# Patient Record
Sex: Female | Born: 1990 | Race: Black or African American | Hispanic: No | Marital: Single | Smoking: Former smoker
Health system: Southern US, Community
[De-identification: ages and names within clinical notes are randomized; demographics above are authoritative.]

## PROBLEM LIST (undated history)

## (undated) DIAGNOSIS — R000 Tachycardia, unspecified: Secondary | ICD-10-CM

## (undated) DIAGNOSIS — I10 Essential (primary) hypertension: Secondary | ICD-10-CM

## (undated) DIAGNOSIS — E119 Type 2 diabetes mellitus without complications: Secondary | ICD-10-CM

## (undated) DIAGNOSIS — N939 Abnormal uterine and vaginal bleeding, unspecified: Secondary | ICD-10-CM

## (undated) HISTORY — DX: Type 2 diabetes mellitus without complications: E11.9

## (undated) HISTORY — DX: Abnormal uterine and vaginal bleeding, unspecified: N93.9

## (undated) HISTORY — DX: Essential (primary) hypertension: I10

## (undated) HISTORY — DX: Tachycardia, unspecified: R00.0

---

## 2019-06-01 ENCOUNTER — Emergency Department (HOSPITAL_COMMUNITY): Payer: 59

## 2019-06-01 ENCOUNTER — Encounter (HOSPITAL_COMMUNITY): Payer: Self-pay

## 2019-06-01 ENCOUNTER — Emergency Department (HOSPITAL_COMMUNITY)
Admission: EM | Admit: 2019-06-01 | Discharge: 2019-06-01 | Disposition: A | Discharge status: Home / Self Care | Payer: 59 | Attending: Emergency Medicine | Admitting: Emergency Medicine

## 2019-06-01 ENCOUNTER — Other Ambulatory Visit: Payer: Self-pay

## 2019-06-01 DIAGNOSIS — Z79899 Other long term (current) drug therapy: Secondary | ICD-10-CM | POA: Diagnosis not present

## 2019-06-01 DIAGNOSIS — R000 Tachycardia, unspecified: Secondary | ICD-10-CM | POA: Insufficient documentation

## 2019-06-01 DIAGNOSIS — I10 Essential (primary) hypertension: Secondary | ICD-10-CM | POA: Diagnosis not present

## 2019-06-01 DIAGNOSIS — N939 Abnormal uterine and vaginal bleeding, unspecified: Secondary | ICD-10-CM | POA: Diagnosis present

## 2019-06-01 DIAGNOSIS — R1030 Lower abdominal pain, unspecified: Secondary | ICD-10-CM | POA: Diagnosis not present

## 2019-06-01 DIAGNOSIS — F17290 Nicotine dependence, other tobacco product, uncomplicated: Secondary | ICD-10-CM | POA: Diagnosis not present

## 2019-06-01 DIAGNOSIS — R531 Weakness: Secondary | ICD-10-CM | POA: Diagnosis not present

## 2019-06-01 DIAGNOSIS — R0600 Dyspnea, unspecified: Secondary | ICD-10-CM | POA: Diagnosis not present

## 2019-06-01 DIAGNOSIS — R42 Dizziness and giddiness: Secondary | ICD-10-CM | POA: Insufficient documentation

## 2019-06-01 LAB — COMPREHENSIVE METABOLIC PANEL
ALT: 32 U/L (ref 0–44)
AST: 32 U/L (ref 15–41)
Albumin: 3.8 g/dL (ref 3.5–5.0)
Alkaline Phosphatase: 66 U/L (ref 38–126)
Anion gap: 14 (ref 5–15)
BUN: 9 mg/dL (ref 6–20)
CO2: 22 mmol/L (ref 22–32)
Calcium: 8.9 mg/dL (ref 8.9–10.3)
Chloride: 100 mmol/L (ref 98–111)
Creatinine, Ser: 0.75 mg/dL (ref 0.44–1.00)
GFR calc Af Amer: >60 mL/min (ref >60)
GFR calc non Af Amer: >60 mL/min (ref >60)
Glucose, Bld: 263 mg/dL — ABNORMAL HIGH (ref 70–99)
Potassium: 4.2 mmol/L (ref 3.5–5.1)
Sodium: 136 mmol/L (ref 135–145)
Total Bilirubin: 0.9 mg/dL (ref 0.3–1.2)
Total Protein: 7.6 g/dL (ref 6.5–8.1)

## 2019-06-01 LAB — URINALYSIS, ROUTINE W REFLEX MICROSCOPIC
Bacteria, UA: NONE SEEN
Bilirubin Urine: NEGATIVE
Glucose, UA: >=500 mg/dL — AB
Ketones, ur: 80 mg/dL — AB
Leukocytes,Ua: NEGATIVE
Nitrite: NEGATIVE
Protein, ur: 100 mg/dL — AB
RBC / HPF: >50 RBC/hpf — ABNORMAL HIGH (ref 0–5)
Specific Gravity, Urine: 1.035 — ABNORMAL HIGH (ref 1.005–1.030)
pH: 5 (ref 5.0–8.0)

## 2019-06-01 LAB — CBC WITH DIFFERENTIAL/PLATELET
Abs Immature Granulocytes: 0.02 10*3/uL (ref 0.00–0.07)
Basophils Absolute: 0 10*3/uL (ref 0.0–0.1)
Basophils Relative: 1 %
Eosinophils Absolute: 0.1 10*3/uL (ref 0.0–0.5)
Eosinophils Relative: 1 %
HCT: 43.3 % (ref 36.0–46.0)
Hemoglobin: 14.4 g/dL (ref 12.0–15.0)
Immature Granulocytes: 0 %
Lymphocytes Relative: 34 %
Lymphs Abs: 2.8 10*3/uL (ref 0.7–4.0)
MCH: 31 pg (ref 26.0–34.0)
MCHC: 33.3 g/dL (ref 30.0–36.0)
MCV: 93.1 fL (ref 80.0–100.0)
Monocytes Absolute: 0.5 10*3/uL (ref 0.1–1.0)
Monocytes Relative: 6 %
Neutro Abs: 4.7 10*3/uL (ref 1.7–7.7)
Neutrophils Relative %: 58 %
Platelets: 347 10*3/uL (ref 150–400)
RBC: 4.65 MIL/uL (ref 3.87–5.11)
RDW: 12.4 % (ref 11.5–15.5)
WBC: 8.2 10*3/uL (ref 4.0–10.5)
nRBC: 0 % (ref 0.0–0.2)

## 2019-06-01 LAB — WET PREP, GENITAL
Clue Cells Wet Prep HPF POC: NONE SEEN
Sperm: NONE SEEN
Trich, Wet Prep: NONE SEEN
Yeast Wet Prep HPF POC: NONE SEEN

## 2019-06-01 LAB — TSH: TSH: 1.636 u[IU]/mL (ref 0.350–4.500)

## 2019-06-01 LAB — TYPE AND SCREEN
ABO/RH(D): O POS
Antibody Screen: NEGATIVE

## 2019-06-01 LAB — I-STAT BETA HCG BLOOD, ED (MC, WL, AP ONLY): I-stat hCG, quantitative: <5 m[IU]/mL (ref <5)

## 2019-06-01 MED ORDER — IOHEXOL 350 MG/ML SOLN
100.0000 mL | Freq: Once | INTRAVENOUS | Status: AC | PRN
  Administered 2019-06-01: 100 mL via INTRAVENOUS

## 2019-06-01 MED ORDER — SODIUM CHLORIDE 0.9 % IV BOLUS: 1000.0000 mL | Freq: Once | INTRAVENOUS | Status: DC

## 2019-06-01 MED ORDER — SODIUM CHLORIDE 0.9 % IV BOLUS
1000.0000 mL | Freq: Once | INTRAVENOUS | Status: AC
  Administered 2019-06-01: 1000 mL via INTRAVENOUS

## 2019-06-01 MED ORDER — MEGESTROL ACETATE 40 MG PO TABS: ORAL_TABLET | ORAL | 0 refills | Status: AC

## 2019-06-01 MED ORDER — SODIUM CHLORIDE (PF) 0.9 % IJ SOLN
INTRAMUSCULAR | Status: AC
  Filled 2019-06-01: qty 50

## 2019-06-01 NOTE — ED Provider Notes (Signed)
[facility] DEPT Provider Note   CSN: [id number] Arrival date & time: 06/01/19  1157     History Chief Complaint  Patient presents with  . Vaginal Bleeding    Lindsay Welch is a 29 y.o. female presents today for evaluation of acute onset, progressively worsening vaginal bleeding.  She reports that she had a regular menstrual bleeding since August after losing her job.  Reports that she will have constant bleeding daily for over a month and then it will stop for a couple of weeks and then return again.  Reports that most recently her bleeding started up again 3 days ago and notes that it is quite heavy, worse than usual.  Reports she is saturating a pad every 2 hours and has gone through 7 pads since yesterday.  Notes some mild lower abdominal cramping consistent with her usual menstrual cramps.  Denies nausea or vomiting.  Reports she has felt generally weak with dyspnea on exertion and lightheadedness with position changes.  Denies syncope, fevers, chills, chest pain.  She has noted some urinary urgency and frequency but denies dysuria, no abnormal vaginal discharge.  Has not tried anything for her symptoms.  She is a non-smoker.  The history is provided by the patient.       History reviewed. No pertinent past medical history.  There are no problems to display for this patient.   History reviewed. No pertinent surgical history.   OB History   No obstetric history on file.     No family history on file.  Social History   Tobacco Use  . Smoking status: Current Some Day Smoker    Types: Cigars  . Smokeless tobacco: Never Used  Substance Use Topics  . Alcohol use: Never  . Drug use: Never    Home Medications Prior to Admission medications   Medication Sig Start Date End Date Taking? Authorizing Provider  Ascorbic Acid (VITAMIN C) 100 MG tablet Take 100 mg by mouth daily.   Yes [provider]  Biotin 1 MG CAPS Take 1 capsule by  mouth daily.   Yes [provider]  cholecalciferol (VITAMIN D3) 25 MCG (1000 UT) tablet Take 1,000 Units by mouth daily.   Yes [provider]  co-enzyme Q-10 30 MG capsule Take 30 mg by mouth daily.   Yes [provider]  Misc Natural Products (TART CHERRY ADVANCED) CAPS Take 1 capsule by mouth daily.   Yes [provider]  Multiple Vitamins-Minerals (ZINC PO) Take 1 tablet by mouth daily.   Yes [provider]  Omega-3 Fatty Acids (FISH OIL) 1000 MG CAPS Take 1 capsule by mouth daily.   Yes [provider]  vitamin E 100 UNIT capsule Take 100 Units by mouth daily.   Yes [provider]  megestrol (MEGACE) 40 MG tablet Take 3 tablets (120 mg total) by mouth daily for 5 days, THEN 2 tablets (80 mg total) daily for 5 days, THEN 1 tablet (40 mg total) daily. 06/01/19 07/11/19  Rodell Perna A, PA-C    Allergies    Patient has no known allergies.  Review of Systems   Review of Systems  Constitutional: Positive for fatigue. Negative for chills and fever.  Respiratory: Positive for shortness of breath.   Cardiovascular: Positive for palpitations.  Gastrointestinal: Positive for abdominal pain. Negative for constipation, diarrhea, nausea and vomiting.  Genitourinary: Positive for frequency, urgency and vaginal bleeding. Negative for dysuria and hematuria.  Neurological: Positive for light-headedness. Negative for  syncope.  All other systems reviewed and are negative.   Physical Exam Updated Vital Signs BP (!) 141/95 (BP Location: Left Arm)   Pulse 94   Temp 98.6 F (37 C) (Oral)   Resp (!) 22   LMP 06/01/2019 (Approximate)   SpO2 97%   Physical Exam Vitals and nursing note reviewed.  Constitutional:      General: She is not in acute distress.    Appearance: She is well-developed. She is obese.     Comments: Appears anxious  HENT:     Head: Normocephalic and atraumatic.  Eyes:     General:        Right eye: No discharge.         Left eye: No discharge.     Conjunctiva/sclera: Conjunctivae normal.  Neck:     Vascular: No JVD.     Trachea: No tracheal deviation.  Cardiovascular:     Rate and Rhythm: Regular rhythm. Tachycardia present.     Pulses: Normal pulses.     Heart sounds: Normal heart sounds.  Pulmonary:     Effort: Pulmonary effort is normal.     Breath sounds: Normal breath sounds.  Abdominal:     General: Bowel sounds are normal. There is no distension.     Palpations: Abdomen is soft.     Tenderness: There is abdominal tenderness in the right lower quadrant, suprapubic area and left lower quadrant. There is no guarding or rebound.     Comments: Mild discomfort on palpation of the lower abdomen  Genitourinary:    Comments: Examination performed in the presence of a chaperone.  Moderate amount of blood in the vaginal vault.  No cervical motion tenderness or adnexal tenderness.  Examination somewhat limited due to body habitus. Skin:    General: Skin is warm and dry.     Findings: No erythema.  Neurological:     Mental Status: She is alert.  Psychiatric:        Behavior: Behavior normal.     ED Results / Procedures / Treatments   Labs (all labs ordered are listed, but only abnormal results are displayed) Labs Reviewed  WET PREP, GENITAL - Abnormal; Notable for the following components:      Result Value   WBC, Wet Prep HPF POC FEW (*)    All other components within normal limits  COMPREHENSIVE METABOLIC PANEL - Abnormal; Notable for the following components:   Glucose, Bld 263 (*)    All other components within normal limits  URINALYSIS, ROUTINE W REFLEX MICROSCOPIC - Abnormal; Notable for the following components:   APPearance HAZY (*)    Specific Gravity, Urine 1.035 (*)    Glucose, UA >=500 (*)    Hgb urine dipstick LARGE (*)    Ketones, ur 80 (*)    Protein, ur 100 (*)    RBC / HPF >50 (*)    All other components within normal limits  CBC WITH DIFFERENTIAL/PLATELET  TSH    I-STAT BETA HCG BLOOD, ED ([facility], [facility], [facility] ONLY)  TYPE AND SCREEN  ABO/RH  GC/CHLAMYDIA PROBE AMP ([place]) NOT AT [facility]    EKG EKG Interpretation  Date/Time:  Saturday June 01 2019 15:50:32 EST Ventricular Rate:  105 PR Interval:    QRS Duration: 79 QT Interval:  331 QTC Calculation: 438 R Axis:   50 Text Interpretation: Sinus tachycardia No old tracing to compare Confirmed by Mancel Bale [phone]) on 06/01/2019 4:37:25 PM   Radiology CT Angio Chest PE  W and/or Wo Contrast  Result Date: 06/01/2019 CLINICAL DATA:  Chest pain and shortness of breath. Concern for pulmonary embolism. EXAM: CT ANGIOGRAPHY CHEST WITH CONTRAST TECHNIQUE: Multidetector CT imaging of the chest was performed using the standard protocol during bolus administration of intravenous contrast. Multiplanar CT image reconstructions and MIPs were obtained to evaluate the vascular anatomy. CONTRAST:  OMNIPAQUE IOHEXOL 350 MG/ML SOLN COMPARISON:  None. FINDINGS: Cardiovascular: Multifactorial degradation. Primary limitations include motion and patient body habitus. The bolus is poorly timed, further limiting evaluation for pulmonary embolism. No central or large lobar pulmonary embolism. Normal aortic caliber. No dissection. Normal heart size, without pericardial effusion. Mediastinum/Nodes: No mediastinal or hilar adenopathy. Lungs/Pleura: No pleural fluid. Clear lungs. Upper Abdomen: Probable mild hepatic steatosis. Normal imaged portions of the spleen, stomach, pancreas, adrenal glands. Musculoskeletal: No acute osseous abnormality. Mild convex right thoracic spine curvature. Review of the MIP images confirms the above findings. IMPRESSION: 1. Multifactorial degradation, including patient body habitus, motion, and suboptimal bolus timing. No large or central pulmonary embolism. 2. No other explanation for chest pain or shortness of breath. 3. Probable mild hepatic steatosis. Electronically Signed   By: Jeronimo Greaves M.D.    On: 06/01/2019 19:13    Procedures Procedures (including critical care time)  Medications Ordered in ED Medications  sodium chloride (PF) 0.9 % injection (has no administration in time range)  sodium chloride 0.9 % bolus 1,000 mL (0 mLs Intravenous Stopped 06/01/19 1754)  iohexol (OMNIPAQUE) 350 MG/ML injection 100 mL (100 mLs Intravenous Contrast Given 06/01/19 1841)    ED Course  I have reviewed the triage vital signs and the nursing notes.  Pertinent labs & imaging results that were available during my care of the patient were reviewed by me and considered in my medical decision making (see chart for details).    MDM Rules/Calculators/A&P                      Patient presenting for evaluation of abnormal vaginal bleeding that has been ongoing since August (5-6 months).  She is afebrile, initially tachycardic and hypertensive with improvement in heart rate after administration of fluids.  She has had a little bit of generalized weakness and lightheadedness with ambulation, mild dyspnea on exertion however with ambulation her O2 saturations are stable.  She has no real risk factors for PE but cannot PERC her out so a CTA was obtained which showed no evidence of PE.  Doubt ACS/MI, cardiac tamponade, 70 or short, pneumonia or pneumothorax causing her symptoms.  Abdomen is soft with no peritoneal signs.  She has some mild lower abdominal discomfort that has been ongoing with bleeding.  Lab work reviewed by me shows no leukocytosis, no anemia, no metabolic derangements, no renal insufficiency.  Her UA has large amount of blood but this is likely contaminant from vaginal bleeding.  Specific gravity is elevated and she has some ketonuria and proteinuria so likely dehydrated.  A TSH was also obtained as a potential cause of her tachycardia and vaginal bleeding but this was within normal limits.  Her pregnancy test is negative.  Doubt ectopic pregnancy in this case.  Doubt TOA in the absence of fever,  leukocytosis and given the duration of her symptoms.  Doubt ovarian torsion given her pain is quite mild and again has been ongoing for several months.  Doubt acute surgical abdominal pathology.  Emergent imaging is not indicated at this time.  On re-evaluation patient resting comfortably, albeit still  appears anxious. Her anxiety may also be contributing to her abnormal vital signs.  Both heart rate and blood pressure have improved while in the ED.  I recommend follow-up with cardiology on an outpatient basis for re-evaluation of tachycardia and hypertension. Also recommend follow-up with OB/GYN for reevaluation of her dysfunctional uterine bleeding.  We will start her on Megace.  Advised to avoid smoking entirely while on this medication.  Discussed strict ED return precautions. Patient verbalized understanding of and agreement with plan and is safe for discharge home at this time. Discussed workup and plan of care with Dr. Effie Shy who agrees with assessment and plan at this time.     Final Clinical Impression(s) / ED Diagnoses Final diagnoses:  Abnormal vaginal bleeding  Tachycardia  Hypertension, unspecified type    Rx / DC Orders ED Discharge Orders         Ordered    megestrol (MEGACE) 40 MG tablet     06/01/19 2008    Ambulatory referral to Cardiology    Comments: Tachycardia and hypertension   06/01/19 2008           Bennye Alm 06/01/19 2027    Mancel Bale, MD 06/02/19 1219

## 2019-06-01 NOTE — ED Notes (Signed)
Pelvic exam at bedside

## 2019-06-01 NOTE — ED Notes (Signed)
Pt ambulated to restroom w/o assistance. 

## 2019-06-01 NOTE — ED Notes (Signed)
Pt provided cup of water & sandwich.

## 2019-06-01 NOTE — ED Notes (Signed)
Clean catch obtained for urine.

## 2019-06-01 NOTE — ED Triage Notes (Signed)
Pt presents with c/o vaginal bleeding. Pt reports that she was passing some clots yesterday, none today. Pt reports she was bleeding for approx one month, the bleeding stopped around Thanksgiving for a couple of weeks and it has now started back up.

## 2019-06-01 NOTE — ED Notes (Signed)
o2 sat remained 99%-100% while ambulating.

## 2019-06-01 NOTE — Discharge Instructions (Signed)
Start taking Megace as prescribed.  You will take 3 tablets daily for 5 days, 2 tablets daily for 5 days, then 1 tablet daily. Do not smoke while on this medication.   You can take 1 to 2 tablets of Tylenol (350mg -1000mg  depending on the dose) every 6 hours as needed for pain.  Do not exceed 4000 mg of Tylenol daily.  If your pain persists you can take a dose of ibuprofen in between doses of Tylenol.  I usually recommend 400 to 600 mg of ibuprofen every 6 hours.  Take this with food to avoid upset stomach issues.  Drink plenty of fluids and get some rest.  Call the OB/GYN on Monday to schedule follow-up appointment for reevaluation of your bleeding.  Call cardiology to schedule follow-up for reevaluation of your mildly fast heart rate and blood pressure which was a little high today.

## 2019-06-02 LAB — ABO/RH: ABO/RH(D): O POS

## 2019-06-04 ENCOUNTER — Other Ambulatory Visit: Payer: Self-pay

## 2019-06-04 ENCOUNTER — Telehealth: Payer: Self-pay

## 2019-06-04 ENCOUNTER — Encounter: Payer: Self-pay | Admitting: Cardiology

## 2019-06-04 ENCOUNTER — Telehealth (INDEPENDENT_AMBULATORY_CARE_PROVIDER_SITE_OTHER): Payer: Self-pay | Admitting: Cardiology

## 2019-06-04 VITALS — Ht 64.0 in | Wt 300.0 lb

## 2019-06-04 DIAGNOSIS — R000 Tachycardia, unspecified: Secondary | ICD-10-CM

## 2019-06-04 DIAGNOSIS — I10 Essential (primary) hypertension: Secondary | ICD-10-CM

## 2019-06-04 DIAGNOSIS — R0683 Snoring: Secondary | ICD-10-CM

## 2019-06-04 LAB — GC/CHLAMYDIA PROBE AMP (~~LOC~~) NOT AT ARMC
Chlamydia: NEGATIVE
Neisseria Gonorrhea: UNDETERMINED

## 2019-06-04 NOTE — Patient Instructions (Addendum)
Medication Instructions:  Your physician recommends that you continue on your current medications as directed. Please refer to the Current Medication list given to you today.  *If you need a refill on your cardiac medications before your next appointment, please call your pharmacy*  Testing/Procedures: Your physician has requested that you have an echocardiogram. Echocardiography is a painless test that uses sound waves to create images of your heart. It provides your doctor with information about the size and shape of your heart and how well your heart's chambers and valves are working. This procedure takes approximately one hour. There are no restrictions for this procedure.  Your physician has requested that you wear a 24-hour blood pressure monitor.   Your physician has recommended that you have a sleep study. This test records several body functions during sleep, including: brain activity, eye movement, oxygen and carbon dioxide blood levels, heart rate and rhythm, breathing rate and rhythm, the flow of air through your mouth and nose, snoring, body muscle movements, and chest and belly movement.   Follow-Up: At [facility], you and your health needs are our priority.  As part of our continuing mission to provide you with exceptional heart care, we have created designated Provider Care Teams.  These Care Teams include your primary Cardiologist (physician) and Advanced Practice Providers (APPs -  Physician Assistants and Nurse Practitioners) who all work together to provide you with the care you need, when you need it.  Follow-up with Dr. Mayford Knife as needed.   Other Instructions  Low-Sodium Eating Plan Sodium, which is an element that makes up salt, helps you maintain a healthy balance of fluids in your body. Too much sodium can increase your blood pressure and cause fluid and waste to be held in your body. Your health care provider or dietitian may recommend following this plan if you  have high blood pressure (hypertension), kidney disease, liver disease, or heart failure. Eating less sodium can help lower your blood pressure, reduce swelling, and protect your heart, liver, and kidneys. What are tips for following this plan? General guidelines  Most people on this plan should limit their sodium intake to 1,500-2,000 mg (milligrams) of sodium each day. Reading food labels   The Nutrition Facts label lists the amount of sodium in one serving of the food. If you eat more than one serving, you must multiply the listed amount of sodium by the number of servings.  Choose foods with less than 140 mg of sodium per serving.  Avoid foods with 300 mg of sodium or more per serving. Shopping  Look for lower-sodium products, often labeled as "low-sodium" or "no salt added."  Always check the sodium content even if foods are labeled as "unsalted" or "no salt added".  Buy fresh foods. ? Avoid canned foods and premade or frozen meals. ? Avoid canned, cured, or processed meats  Buy breads that have less than 80 mg of sodium per slice. Cooking  Eat more home-cooked food and less restaurant, buffet, and fast food.  Avoid adding salt when cooking. Use salt-free seasonings or herbs instead of table salt or sea salt. Check with your health care provider or pharmacist before using salt substitutes.  Cook with plant-based oils, such as canola, sunflower, or olive oil. Meal planning  When eating at a restaurant, ask that your food be prepared with less salt or no salt, if possible.  Avoid foods that contain MSG (monosodium glutamate). MSG is sometimes added to [place] food, bouillon, and some canned foods.  What foods are recommended? The items listed may not be a complete list. Talk with your dietitian about what dietary choices are best for you. Grains Low-sodium cereals, including oats, puffed wheat and rice, and shredded wheat. Low-sodium crackers. Unsalted rice. Unsalted pasta.  Low-sodium bread. Whole-grain breads and whole-grain pasta. Vegetables Fresh or frozen vegetables. "No salt added" canned vegetables. "No salt added" tomato sauce and paste. Low-sodium or reduced-sodium tomato and vegetable juice. Fruits Fresh, frozen, or canned fruit. Fruit juice. Meats and other protein foods Fresh or frozen (no salt added) meat, poultry, seafood, and fish. Low-sodium canned tuna and salmon. Unsalted nuts. Dried peas, beans, and lentils without added salt. Unsalted canned beans. Eggs. Unsalted nut butters. Dairy Milk. Soy milk. Cheese that is naturally low in sodium, such as ricotta cheese, fresh mozzarella, or Swiss cheese Low-sodium or reduced-sodium cheese. Cream cheese. Yogurt. Fats and oils Unsalted butter. Unsalted margarine with no trans fat. Vegetable oils such as canola or olive oils. Seasonings and other foods Fresh and dried herbs and spices. Salt-free seasonings. Low-sodium mustard and ketchup. Sodium-free salad dressing. Sodium-free light mayonnaise. Fresh or refrigerated horseradish. Lemon juice. Vinegar. Homemade, reduced-sodium, or low-sodium soups. Unsalted popcorn and pretzels. Low-salt or salt-free chips. What foods are not recommended? The items listed may not be a complete list. Talk with your dietitian about what dietary choices are best for you. Grains Instant hot cereals. Bread stuffing, pancake, and biscuit mixes. Croutons. Seasoned rice or pasta mixes. Noodle soup cups. Boxed or frozen macaroni and cheese. Regular salted crackers. Self-rising flour. Vegetables Sauerkraut, pickled vegetables, and relishes. Olives. [place] fries. Onion rings. Regular canned vegetables (not low-sodium or reduced-sodium). Regular canned tomato sauce and paste (not low-sodium or reduced-sodium). Regular tomato and vegetable juice (not low-sodium or reduced-sodium). Frozen vegetables in sauces. Meats and other protein foods Meat or fish that is salted, canned, smoked, spiced,  or pickled. Bacon, ham, sausage, hotdogs, corned beef, chipped beef, packaged lunch meats, salt pork, jerky, pickled herring, anchovies, regular canned tuna, sardines, salted nuts. Dairy Processed cheese and cheese spreads. Cheese curds. Blue cheese. Feta cheese. String cheese. Regular cottage cheese. Buttermilk. Canned milk. Fats and oils Salted butter. Regular margarine. Ghee. Bacon fat. Seasonings and other foods Onion salt, garlic salt, seasoned salt, table salt, and sea salt. Canned and packaged gravies. Worcestershire sauce. Tartar sauce. Barbecue sauce. Teriyaki sauce. Soy sauce, including reduced-sodium. Steak sauce. Fish sauce. Oyster sauce. Cocktail sauce. Horseradish that you find on the shelf. Regular ketchup and mustard. Meat flavorings and tenderizers. Bouillon cubes. Hot sauce and Tabasco sauce. Premade or packaged marinades. Premade or packaged taco seasonings. Relishes. Regular salad dressings. Salsa. Potato and tortilla chips. Corn chips and puffs. Salted popcorn and pretzels. Canned or dried soups. Pizza. Frozen entrees and pot pies. Summary  Eating less sodium can help lower your blood pressure, reduce swelling, and protect your heart, liver, and kidneys.  Most people on this plan should limit their sodium intake to 1,500-2,000 mg (milligrams) of sodium each day.  Canned, boxed, and frozen foods are high in sodium. Restaurant foods, fast foods, and pizza are also very high in sodium. You also get sodium by adding salt to food.  Try to cook at home, eat more fresh fruits and vegetables, and eat less fast food, canned, processed, or prepared foods. This information is not intended to replace advice given to you by your health care provider. Make sure you discuss any questions you have with your health care provider. Document Revised: 04/28/2017 Document Reviewed: 05/09/2016  Elsevier Patient Education  2020 Elsevier Inc.   

## 2019-06-04 NOTE — Telephone Encounter (Signed)
Patient Name: Lindsay Welch        DOB: 10-May-1991      Height: 5\' 4"     Weight: 300lb  Office Name: [facility]         Referring Provider: Dr. [facility]  Today's Date: 06/04/2019  Date:   STOP BANG RISK ASSESSMENT S (snore) Have you been told that you snore? Yes     YES/NO   T (tired) Are you often tired, fatigued, or sleepy during the day? Yes  YES/NO  O (obstruction) Do you stop breathing, choke, or gasp during sleep? Yes YES/NO   P (pressure) Do you have or are you being treated for high blood pressure? No YES/NO   B (BMI) Is your body index greater than 35 kg/m? Yes YES/NO   A (age) Are you 72 years old or older? No YES/NO   N (neck) Do you have a neck circumference greater than 16 inches? Yes  YES/NO   G (gender) Are you a female? No YES/NO   TOTAL STOP/BANG "YES" ANSWERS                                                                        For Office Use Only              Procedure Order Form    YES to 3+ Stop Bang questions OR two clinical symptoms - patient qualifies for WatchPAT (CPT 95800)     Submit: This Form + Patient Face Sheet + Clinical Note via Cloud[employer] or Fax: [phone]         Clinical Notes: Will consult Sleep Specialist and refer for management of therapy due to patient increased risk of Sleep Apnea. Ordering a sleep study due to the following two clinical symptoms: Excessive daytime sleepiness G47.10 / Gastroesophageal reflux K21.9 / Nocturia R35.1 / Morning Headaches G44.221 / Difficulty concentrating R41.840 / Memory problems or poor judgment G31.84 / Personality changes or irritability R45.4 / Loud snoring R06.83 / Depression F32.9 / Unrefreshed by sleep G47.8 / Impotence N52.9 / History of high blood pressure R03.0 / Insomnia G47.00    I understand that I am proceeding with a home sleep apnea test as ordered by my treating physician. I understand that untreated sleep apnea is a serious cardiovascular risk factor and it is my responsibility  to perform the test and seek management for sleep apnea. I will be contacted with the results and be managed for sleep apnea by a local sleep physician. I will be receiving equipment and further instructions from [facility]. I shall promptly ship back the equipment via the included mailing label. I understand my insurance will be billed for the test and as the patient I am responsible for any insurance related out-of-pocket costs incurred. I have been provided with written instructions and can call for additional video or telephonic instruction, with 24-hour availability of qualified personnel to answer any questions: Patient Help Desk [phone].   Patient Telemedicine Verbal Consent

## 2019-06-04 NOTE — Progress Notes (Signed)
Virtual Visit via Video Note   This visit type was conducted due to national recommendations for restrictions regarding the COVID-19 Pandemic (e.g. social distancing) in an effort to limit this patient's exposure and mitigate transmission in our community.  Due to her co-morbid illnesses, this patient is at least at moderate risk for complications without adequate follow up.  This format is felt to be most appropriate for this patient at this time.  The patient did not have access to video technology/had technical difficulties with video requiring transitioning to audio format only (telephone).  All issues noted in this document were discussed and addressed.  No physical exam could be performed with this format.  Please refer to the patient's chart for her  consent to telehealth for [facility].   Evaluation Performed:  Cardiology Consult  This visit type was conducted due to national recommendations for restrictions regarding the COVID-19 Pandemic (e.g. social distancing).  This format is felt to be most appropriate for this patient at this time.  All issues noted in this document were discussed and addressed.  No physical exam was performed (except for noted visual exam findings with Video Visits).  Please refer to the patient's chart (MyChart message for video visits and phone note for telephone visits) for the patient's consent to telehealth for [place].  Date:  06/04/2019   ID:  Lindsay Welch, DOB 1990-08-18, MRN [id number]  Patient Location:  Home  Provider location:   [facility]  PCP:  Patient, No Pcp Per  Cardiologist:  Fransico Him, MD  Electrophysiologist:  None   Chief Complaint:  HTN and tachycardia  History of Present Illness:    Lindsay Welch is a 29 y.o. female who presents via audio/video conferencing for a telehealth visit today for Cardiology consult in referral by  Rodell Perna, PA from [facility] ER for evaluation of HTN and tachycardia.   This is a 29yo female with  a hx of heavy menstrual cycles. She has no hx of cardiac dz and her only fm hx is HTN.  SHe was recently seen in the ER for heavy vaginal bleeding and was noted to have mild tachycardia with a HR of 105bpm.  She was given fluids and her HR improved into the 90's.  She was also noted to be mildly hypertensive with a BP of 141/30mmHg.  Her Hbg was normal in the ER.  She was told to followup with Cards in regards to eleated HR and BP.  She has no hx of HTN in the past but currently does not have a PCP.  She has had some chest pressure under her left breast and under her left arm but it is nonexertional and goes away when she works or exerts herself.  She only has SOB when she has heavy periods.  She denies any palpitations, LE edema, PND or orthopnea.  She will get dizzy when her periods are heavy but no syncope.  She only smokes socially.   The patient does not have symptoms concerning for COVID-19 infection (fever, chills, cough, or new shortness of breath).    Prior CV studies:   The following studies were reviewed today: EKG from ER  Past Medical History:  Diagnosis Date  . Hypertension   . Tachycardia   . Vaginal bleeding    No past surgical history on file.   Current Meds  Medication Sig  . megestrol (MEGACE) 40 MG tablet Take 3 tablets (120 mg total) by mouth daily for 5 days, THEN 2  tablets (80 mg total) daily for 5 days, THEN 1 tablet (40 mg total) daily.     Allergies:   Patient has no known allergies.   Social History   Tobacco Use  . Smoking status: Current Some Day Smoker    Types: Cigars  . Smokeless tobacco: Never Used  Substance Use Topics  . Alcohol use: Never  . Drug use: Never     Family Hx: The patient's family history includes Hypertension in her father and mother.  ROS:   Please see the history of present illness.     All other systems reviewed and are negative.   Labs/Other Tests and Data Reviewed:    Recent Labs: 06/01/2019: ALT 32; BUN 9; Creatinine,  Ser 0.75; Hemoglobin 14.4; Platelets 347; Potassium 4.2; Sodium 136; TSH 1.636   Recent Lipid Panel No results found for: CHOL, TRIG, HDL, CHOLHDL, LDLCALC, LDLDIRECT  Wt Readings from Last 3 Encounters:  06/04/19 300 lb (136.1 kg)     Objective:    Vital Signs:  Ht 5\' 4"  (1.626 m)   Wt 300 lb (136.1 kg)   LMP 06/01/2019 (Approximate)   BMI 51.49 kg/m    CONSTITUTIONAL:  Well nourished, well developed female in no acute distress.  EYES: anicteric MOUTH: oral mucosa is pink RESPIRATORY: Normal respiratory effort, symmetric expansion CARDIOVASCULAR: No peripheral edema SKIN: No rash, lesions or ulcers MUSCULOSKELETAL: no digital cyanosis NEURO: Cranial Nerves II-XII grossly intact, moves all extremities PSYCH: Intact judgement and insight.  A&O x 3, Mood/affect appropriate   ASSESSMENT & PLAN:    1.  Tachycardia -this was in setting of heavy vaginal bleeding and suspect she was volume depleted -this resolved with IVFs -TSH was normal  2.  HTN -BP mildly elevated in the ER -she does not have a BP cuff at home and is currently in between PCPs -I counseled her on high Na foods to avoid and to try to eat < 2gm Na in diet daily -I will get a 24 hour BP cuff to assess BP control -suspect she may have OSA which could be contributing to elevated BP  3.  Snoring -she is morbidly obese with HTN -I suspect she has significant sleep apnea -will get an [place] home sleep study  4.  Morbid Obesity -encouraged her to try to eat more healthy with more fruits and veggies  5.  Atypical CP -this is very atypical and with no associated sx -CP only occurs at rest and goes away when working -her only CRFs are morbid obesity and HTN.  She only rarely smokes -EKG is normal -check 2D echo to assess LVF -given her body habitus she would be a poor candidate for coronary CTA or nuclear stress test  COVID-19 Education: The signs and symptoms of COVID-19 were discussed with the patient and  how to seek care for testing (follow up with PCP or arrange E-visit).  The importance of social distancing was discussed today.  Patient Risk:   After full review of this patient's clinical status, I feel that they are at least moderate risk at this time.  Time:   Today, I have spent 20 minutes directly with the patient on telemedicine discussing medical problems including tachycardia and HTN.  We also reviewed the symptoms of COVID 19 and the ways to protect against contracting the virus with telehealth technology.  I spent an additional 5 minutes reviewing patient's chart including PCP notes.  Medication Adjustments/Labs and Tests Ordered: Current medicines are reviewed at length with  the patient today.  Concerns regarding medicines are outlined above.  Tests Ordered: No orders of the defined types were placed in this encounter.  Medication Changes: No orders of the defined types were placed in this encounter.   Disposition:  Follow up PRN  Signed, Armanda Magic, MD  06/04/2019 8:52 AM    [facility]

## 2019-06-12 ENCOUNTER — Other Ambulatory Visit: Payer: Self-pay

## 2019-06-12 ENCOUNTER — Ambulatory Visit (HOSPITAL_COMMUNITY): Payer: 59 | Attending: Cardiology

## 2019-06-12 DIAGNOSIS — R000 Tachycardia, unspecified: Secondary | ICD-10-CM

## 2019-06-12 DIAGNOSIS — R0683 Snoring: Secondary | ICD-10-CM | POA: Diagnosis present

## 2019-06-12 DIAGNOSIS — I10 Essential (primary) hypertension: Secondary | ICD-10-CM

## 2019-06-14 ENCOUNTER — Ambulatory Visit (INDEPENDENT_AMBULATORY_CARE_PROVIDER_SITE_OTHER): Payer: 59 | Admitting: Family Medicine

## 2019-06-14 ENCOUNTER — Other Ambulatory Visit: Payer: Self-pay

## 2019-06-14 ENCOUNTER — Encounter: Payer: Self-pay | Admitting: Family Medicine

## 2019-06-14 VITALS — BP 137/100 | HR 116 | Ht 64.0 in | Wt 287.0 lb

## 2019-06-14 DIAGNOSIS — L0291 Cutaneous abscess, unspecified: Secondary | ICD-10-CM

## 2019-06-14 DIAGNOSIS — N939 Abnormal uterine and vaginal bleeding, unspecified: Secondary | ICD-10-CM

## 2019-06-14 MED ORDER — ESTRADIOL VALERATE-DIENOGEST 3/2-2/2-3/1 MG PO TABS: 1.0000 | ORAL_TABLET | Freq: Every day | ORAL | 11 refills | Status: DC

## 2019-06-14 MED ORDER — DOXYCYCLINE MONOHYDRATE 100 MG PO TABS: 100.0000 mg | ORAL_TABLET | Freq: Two times a day (BID) | ORAL | 0 refills | Status: AC

## 2019-06-14 NOTE — Progress Notes (Signed)
Pt presents as a New Patient for AUB. Bleeding started mid November and stopped 2 weeks later and then bleeding started back at the end of of November -18 December. Bleeding stopped for about 2 weeks and returned on the 31 December. Patient went to the ER 06/01/19 due to changing pads every 1.5-2 hours. She was started on Megace which has stopped her bleeding except for occasional spotting. She also complains of having to boils one on her lower abdominal area and one in her groin.

## 2019-06-14 NOTE — Progress Notes (Signed)
GYNECOLOGY OFFICE NOTE  History:  29 y.o. No obstetric history on file. here today for ER follow up of abnormal uterine bleeding.  She was seen at [facility] on 06/01/19 for progressively worsening vaginal bleeding. Her menses were regular until November, after she lost her job in August. After that she says that her periods became longer, and would last for over a month, stop, then return a couple weeks later. Her last period was heavier than usual for her, she saturated a pad every 2 hours, and she went through 7 pads the day before presenting to the ED.   She was discharged home on Megace, which has helped her bleeding, however she has developed 2 painful boils on her abdomen as a result. One of them is oozing pus and both are causing her so much pain that she is barely able to sleep. She has noticed that the oozing one is also bleeding. Both are irritating her and, as she has a low pain tolerance, causing her significant distress. They showed up about 4 days ago, and have progressively become larger and more painful.  Labs from ED reviewed. Hgb 14, pregnancy test and TSH normal. Platelets were within normal range. Glucose was elevated at 263, otherwise CMP was unremarkable.  Past Medical History:  Diagnosis Date  . Hypertension   . Tachycardia   . Vaginal bleeding     History reviewed. No pertinent surgical history.   Current Outpatient Medications:  .  megestrol (MEGACE) 40 MG tablet, Take 3 tablets (120 mg total) by mouth daily for 5 days, THEN 2 tablets (80 mg total) daily for 5 days, THEN 1 tablet (40 mg total) daily., Disp: 55 tablet, Rfl: 0 .  doxycycline (ADOXA) 100 MG tablet, Take 1 tablet (100 mg total) by mouth 2 (two) times daily for 10 days., Disp: 20 tablet, Rfl: 0 .  Estradiol Valerate-Dienogest (NATAZIA) 3/2-2/2-3/1 MG tablet, Take 1 tablet by mouth daily., Disp: 1 Package, Rfl: 11  The following portions of the patient's history were reviewed and updated as appropriate:  allergies, current medications, past family history, past medical history, past social history, past surgical history and problem list.   Review of Systems:  Pertinent items noted in HPI and remainder of comprehensive ROS otherwise negative.   Objective:  Physical Exam BP (!) 137/100   Pulse (!) 116   Ht 5\' 4"  (1.626 m)   Wt 287 lb (130.2 kg)   LMP 05/30/2019   BMI 49.26 kg/m  CONSTITUTIONAL: Well-developed, well-nourished female in no acute distress.  HENT:  Normocephalic, atraumatic. External right and left ear normal. Oropharynx is clear and moist EYES: Conjunctivae and EOM are normal. Pupils are equal, round, and reactive to light. No scleral icterus.  NECK: Normal range of motion, supple, no masses SKIN: Skin is warm and dry. No rash noted. Not diaphoretic. No erythema. No pallor. NEUROLOGIC: Alert and oriented to person, place, and time. Normal reflexes, muscle tone coordination. No cranial nerve deficit noted. PSYCHIATRIC: Normal mood and affect. Normal behavior. Normal judgment and thought content. CARDIOVASCULAR: Normal heart rate noted RESPIRATORY: Effort and breath sounds normal, no problems with respiration noted ABDOMEN: Obese. Soft, no distention noted. One 2 cm diameter abscess noted lateral (right) to the umbilicus, one 1.5 cm abscess (draining) one the patient's left under her panniculus. Both exquisitely tender to touch. PELVIC: Normal appearing external genitalia; normal appearing vaginal mucosa.  No abnormal discharge noted. Uterine size difficult to ascertain, no palpable masses, no uterine or adnexal tenderness.  MUSCULOSKELETAL: Normal range of motion. No edema noted.  Exam done with chaperone present.  Labs and Imaging CT Angio Chest PE W and/or Wo Contrast  Result Date: 06/01/2019 CLINICAL DATA:  Chest pain and shortness of breath. Concern for pulmonary embolism. EXAM: CT ANGIOGRAPHY CHEST WITH CONTRAST TECHNIQUE: Multidetector CT imaging of the chest was  performed using the standard protocol during bolus administration of intravenous contrast. Multiplanar CT image reconstructions and MIPs were obtained to evaluate the vascular anatomy. CONTRAST:  OMNIPAQUE IOHEXOL 350 MG/ML SOLN COMPARISON:  None. FINDINGS: Cardiovascular: Multifactorial degradation. Primary limitations include motion and patient body habitus. The bolus is poorly timed, further limiting evaluation for pulmonary embolism. No central or large lobar pulmonary embolism. Normal aortic caliber. No dissection. Normal heart size, without pericardial effusion. Mediastinum/Nodes: No mediastinal or hilar adenopathy. Lungs/Pleura: No pleural fluid. Clear lungs. Upper Abdomen: Probable mild hepatic steatosis. Normal imaged portions of the spleen, stomach, pancreas, adrenal glands. Musculoskeletal: No acute osseous abnormality. Mild convex right thoracic spine curvature. Review of the MIP images confirms the above findings. IMPRESSION: 1. Multifactorial degradation, including patient body habitus, motion, and suboptimal bolus timing. No large or central pulmonary embolism. 2. No other explanation for chest pain or shortness of breath. 3. Probable mild hepatic steatosis. Electronically Signed   By: Jeronimo Greaves M.D.   On: 06/01/2019 19:13   ECHOCARDIOGRAM COMPLETE  Result Date: 06/12/2019   ECHOCARDIOGRAM REPORT   Patient Name:   Lindsay Welch Date of Exam: 06/12/2019 Medical Rec #:  [id number]     Height:       64.0 in Accession #:    [id number]    Weight:       300.0 lb Date of Birth:  [birth year]     BSA:          2.32 m Patient Age:    28 years      BP:           147/115 mmHg Patient Gender: F             HR:           92 bpm. Exam Location:  [address] Procedure: 2D Echo, 3D Echo, Cardiac Doppler, Color Doppler and Strain Analysis Indications:    R00.0 Tachycardia.  History:        Patient has no prior history of Echocardiogram examinations.                 Risk Factors:Hypertension and Morbid  obesity.  Sonographer:    Garald Braver, RDCS Referring Phys: [phone] TRACI R TURNER IMPRESSIONS  1. The average left ventricular global longitudinal strain is -19.5 %.  2. Left ventricular ejection fraction, by visual estimation, is 60 to 65%. The left ventricle has normal function. There is no left ventricular hypertrophy.  3. The left ventricle has no regional wall motion abnormalities.  4. Left ventricular diastolic parameters are indeterminate.  5. Global right ventricle has normal systolic function.The right ventricular size is normal. No increase in right ventricular wall thickness.  6. Left atrial size was normal.  7. Right atrial size was normal.  8. The mitral valve is normal in structure. Trivial mitral valve regurgitation. No evidence of mitral stenosis.  9. The tricuspid valve is normal in structure. 10. The aortic valve is normal in structure. Aortic valve regurgitation is mild. No evidence of aortic valve sclerosis or stenosis. 11. The pulmonic valve was normal in structure. Pulmonic valve regurgitation is not visualized. 12. The inferior vena  cava is normal in size with greater than 50% respiratory variability, suggesting right atrial pressure of 3 mmHg. 13. Aortic dilatation noted. 14. There is mild dilatation of the ascending aorta measuring 38 mm. FINDINGS  Left Ventricle: Left ventricular ejection fraction, by visual estimation, is 60 to 65%. The left ventricle has normal function. The average left ventricular global longitudinal strain is -19.5 %. The left ventricle has no regional wall motion abnormalities. There is no left ventricular hypertrophy. Left ventricular diastolic parameters are indeterminate. Normal left atrial pressure. Right Ventricle: The right ventricular size is normal. No increase in right ventricular wall thickness. Global RV systolic function is has normal systolic function. Left Atrium: Left atrial size was normal in size. Right Atrium: Right atrial size was normal in size  Pericardium: There is no evidence of pericardial effusion. Mitral Valve: The mitral valve is normal in structure. Trivial mitral valve regurgitation. No evidence of mitral valve stenosis by observation. Tricuspid Valve: The tricuspid valve is normal in structure. Tricuspid valve regurgitation is not demonstrated. Aortic Valve: The aortic valve is normal in structure. Aortic valve regurgitation is mild. The aortic valve is structurally normal, with no evidence of sclerosis or stenosis. Pulmonic Valve: The pulmonic valve was normal in structure. Pulmonic valve regurgitation is not visualized. Pulmonic regurgitation is not visualized. Aorta: The aortic root, ascending aorta and aortic arch are all structurally normal, with no evidence of dilitation or obstruction and aortic dilatation noted. There is mild dilatation of the ascending aorta measuring 38 mm. Venous: The inferior vena cava is normal in size with greater than 50% respiratory variability, suggesting right atrial pressure of 3 mmHg. IAS/Shunts: No atrial level shunt detected by color flow Doppler. There is no evidence of a patent foramen ovale. No ventricular septal defect is seen or detected. There is no evidence of an atrial septal defect.  LEFT VENTRICLE PLAX 2D LVIDd:         4.60 cm  Diastology LVIDs:         3.10 cm  LV e' lateral:   13.10 cm/s LV PW:         0.90 cm  LV E/e' lateral: 7.6 LV IVS:        1.00 cm  LV e' medial:    7.07 cm/s LVOT diam:     2.00 cm  LV E/e' medial:  14.1 LV SV:         59 ml LV SV Index:   23.17    2D Longitudinal Strain LVOT Area:     3.14 cm 2D Strain GLS (A2C):   -17.2 %                         2D Strain GLS (A3C):   -22.6 %                         2D Strain GLS (A4C):   -18.8 %                         2D Strain GLS Avg:     -19.5 %                          3D Volume EF:                         3D EF:  57 %                         LV EDV:       122 ml                         LV ESV:       53 ml                          LV SV:        69 ml RIGHT VENTRICLE RV Basal diam:  2.90 cm RV S prime:     12.50 cm/s TAPSE (M-mode): 1.8 cm LEFT ATRIUM             Index       RIGHT ATRIUM          Index LA diam:        3.70 cm 1.59 cm/m  RA Area:     9.88 cm LA Vol (A2C):   32.5 ml 13.98 ml/m RA Volume:   19.80 ml 8.52 ml/m LA Vol (A4C):   21.9 ml 9.42 ml/m LA Biplane Vol: 29.1 ml 12.52 ml/m  AORTIC VALVE LVOT Vmax:   130.00 cm/s LVOT Vmean:  83.400 cm/s LVOT VTI:    0.228 m  AORTA Ao Root diam: 3.20 cm Ao Asc diam:  3.80 cm MITRAL VALVE                                     SHUNTS                                     Systemic VTI:  0.23 m MV Decel Time: 165 msec             Systemic Diam: 2.00 cm MV E velocity: 99.40 cm/s 103 cm/s MV A velocity: 95.50 cm/s 70.3 cm/s MV E/A ratio:  1.04       1.5  Armanda Magic MD Electronically signed by Armanda Magic MD Signature Date/Time: 06/12/2019/4:25:04 PM    Final     Assessment & Plan:  1. Abnormal uterine bleeding - DDX includes PCOS, uterine fibroids, adenomyosis, endometriosis, bleeding disorder, endocrine causes. 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 TSH in ED normal. Will add extra hormonal testing, iron studies, and Hgb A1c. -Discussed using OCPs to regulate periods, N[place]zia sent. If this doesn't work, may consider transvaginal US and IUD. If A1c elevated, Metformin would be appropriate as well. She may also used NSAIDs. - Diet and exercise were discussed - HgB A1c - Ferritin - Iron Binding Cap (TIBC)([facility]) - Iron - DHEA-sulfate - Protime-INR ( [facility] ONLY) - PTT - Prolactin  2. Abscess - I&D performed, see procedure note below for details - doxycycline (ADOXA) 100 MG tablet; Take 1 tablet (100 mg total) by mouth 2 (two) times daily for 10 days.  Dispense: 20 tablet; Refill: 0  Routine preventative health maintenance measures emphasized. Please refer to After Visit Summary for other counseling  recommendations.   Return in about 2 months (around 08/12/2019) for follow up abnormal uterine bleeding/Pap/GYN annual.  Total face-to-face time with patient: 50 minutes. Over 50% of encounter was spent on counseling  and coordination of care.  Marlowe AltSparacino, Shannan Slinker L, DO OB Fellow, Faculty Practice 06/14/2019 10:27 AM    PROCEDURE NOTE (INCISION AND DRAINAGE) PRE-OP DIAGNOSIS: 2 abdominal superficial abscesses POST-OP DIAGNOSIS: Same  PROCEDURE: incision and drainage of abscess x2 Performing Physician: Dr. Dewitt RotaHailey Caragh Gasper, DO Anesthesia: 2% lidocaine with epinephrine, lot #[id number], expiration date 8/21   PROCEDURE:  A timeout protocol was performed prior to initiating the procedure.  The skin was cleansed with alcohol. Each site was anesthetized with 2.5cc of 2% lidocaine with epinephrine. Then the skin was prepped with betadine and allowed to dry. A small linear incision along the local skin lines was made over each abscess, and the purulent material expressed. Bleeding was minimal.  Packing: was not used   Followup: The patient tolerated the procedure well without complications.  Standard post-procedure care is explained and return precautions are given. Patient will return to clinic in 1 week for wound healing check.  Marlowe AltSparacino, Vardaan Depascale L, DO OB Fellow, Faculty Practice 06/14/2019 10:27 AM

## 2019-06-14 NOTE — Patient Instructions (Addendum)
Incision and Drainage, Care After This sheet gives you information about how to care for yourself after your procedure. Your health care provider may also give you more specific instructions. If you have problems or questions, contact your health care provider. What can I expect after the procedure? After the procedure, it is common to have:  Pain or discomfort around the incision site.  Blood, fluid, or pus (drainage) from the incision.  Redness and firm skin around the incision site. Follow these instructions at home: Medicines  Take over-the-counter and prescription medicines only as told by your health care provider.  If you were prescribed an antibiotic medicine, use or take it as told by your health care provider. Do not stop using the antibiotic even if you start to feel better. Wound care Follow instructions from your health care provider about how to take care of your wound. Make sure you:  Wash your hands with soap and water before and after you change your bandage (dressing). If soap and water are not available, use hand sanitizer.  Change your dressing and packing as told by your health care provider. ? If your dressing is dry or stuck when you try to remove it, moisten or wet the dressing with saline or water so that it can be removed without harming your skin or tissues. ? If your wound is packed, leave it in place until your health care provider tells you to remove it. To remove the packing, moisten or wet the packing with saline or water so that it can be removed without harming your skin or tissues.  Leave stitches (sutures), skin glue, or adhesive strips in place. These skin closures may need to stay in place for 2 weeks or longer. If adhesive strip edges start to loosen and curl up, you may trim the loose edges. Do not remove adhesive strips completely unless your health care provider tells you to do that. Check your wound every day for signs of infection. Check for:   More redness, swelling, or pain.  More fluid or blood.  Warmth.  Pus or a bad smell. If you were sent home with a drain tube in place, follow instructions from your health care provider about:  How to empty it.  How to care for it at home.  General instructions  Rest the affected area.  Do not take baths, swim, or use a hot tub until your health care provider approves. Ask your health care provider if you may take showers. You may only be allowed to take sponge baths.  Return to your normal activities as told by your health care provider. Ask your health care provider what activities are safe for you. Your health care provider may put you on activity or lifting restrictions.  The incision will continue to drain. It is normal to have some clear or slightly bloody drainage. The amount of drainage should lessen each day.  Do not apply any creams, ointments, or liquids unless you have been told to by your health care provider.  Keep all follow-up visits as told by your health care provider. This is important. Contact a health care provider if:  Your cyst or abscess returns.  You have a fever or chills.  You have more redness, swelling, or pain around your incision.  You have more fluid or blood coming from your incision.  Your incision feels warm to the touch.  You have pus or a bad smell coming from your incision.  You have red streaks  above or below the incision site. Get help right away if:  You have severe pain or bleeding.  You cannot eat or drink without vomiting.  You have decreased urine output.  You become short of breath.  You have chest pain.  You cough up blood.  The affected area becomes numb or starts to tingle. These symptoms may represent a serious problem that is an emergency. Do not wait to see if the symptoms will go away. Get medical help right away. Call your local emergency services (911 in the [place] Do not drive yourself to the hospital.  Summary  After this procedure, it is common to have fluid, blood, or pus coming from the surgery site.  Follow all home care instructions. You will be told how to take care of your incision, how to check for infection, and how to take medicines.  If you were prescribed an antibiotic medicine, take it as told by your health care provider. Do not stop taking the antibiotic even if you start to feel better.  Contact a health care provider if you have increased redness, swelling, or pain around your incision. Get help right away if you have chest pain, you vomit, you cough up blood, or you have shortness of breath.  Keep all follow-up visits as told by your health care provider. This is important. This information is not intended to replace advice given to you by your health care provider. Make sure you discuss any questions you have with your health care provider. Document Revised: 04/16/2018 Document Reviewed: 04/16/2018 Elsevier Patient Education  2020 [employer]   Abnormal Uterine Bleeding Abnormal uterine bleeding means bleeding more than usual from your uterus. It can include:  Bleeding between periods.  Bleeding after sex.  Bleeding that is heavier than normal.  Periods that last longer than usual.  Bleeding after you have stopped having your period (menopause). There are many problems that may cause this. You should see a doctor for any kind of bleeding that is not normal. Treatment depends on the cause of the bleeding. Follow these instructions at home:  Watch your condition for any changes.  Do not use tampons, douche, or have sex, if your doctor tells you not to.  Change your pads often.  Get regular well-woman exams. Make sure they include a pelvic exam and cervical cancer screening.  Keep all follow-up visits as told by your doctor. This is important. Contact a doctor if:  The bleeding lasts more than one week.  You feel dizzy at times.  You feel like you  are going to throw up (nauseous).  You throw up. Get help right away if:  You pass out.  You have to change pads every hour.  You have belly (abdominal) pain.  You have a fever.  You get sweaty.  You get weak.  You passing large blood clots from your vagina. Summary  Abnormal uterine bleeding means bleeding more than usual from your uterus.  There are many problems that may cause this. You should see a doctor for any kind of bleeding that is not normal.  Treatment depends on the cause of the bleeding. This information is not intended to replace advice given to you by your health care provider. Make sure you discuss any questions you have with your health care provider. Document Revised: 05/10/2016 Document Reviewed: 05/10/2016 Elsevier Patient Education  2020 [employer].

## 2019-06-15 LAB — PROTIME-INR
INR: 1 (ref 0.9–1.2)
Prothrombin Time: 10.3 s (ref 9.1–12.0)

## 2019-06-15 LAB — IRON AND TIBC
Iron Saturation: 11 % — ABNORMAL LOW (ref 15–55)
Iron: 39 ug/dL (ref 27–159)
Total Iron Binding Capacity: 348 ug/dL (ref 250–450)
UIBC: 309 ug/dL (ref 131–425)

## 2019-06-15 LAB — HEMOGLOBIN A1C
Est. average glucose Bld gHb Est-mCnc: 303 mg/dL
Hgb A1c MFr Bld: 12.2 % — ABNORMAL HIGH (ref 4.8–5.6)

## 2019-06-15 LAB — PROLACTIN: Prolactin: 27.2 ng/mL — ABNORMAL HIGH (ref 4.8–23.3)

## 2019-06-15 LAB — FERRITIN: Ferritin: 171 ng/mL — ABNORMAL HIGH (ref 15–150)

## 2019-06-15 LAB — APTT: aPTT: 24 s (ref 24–33)

## 2019-06-15 LAB — DHEA-SULFATE: DHEA-SO4: 196 ug/dL (ref 84.8–378.0)

## 2019-06-19 ENCOUNTER — Telehealth: Payer: Self-pay | Admitting: Family Medicine

## 2019-06-19 DIAGNOSIS — E611 Iron deficiency: Secondary | ICD-10-CM

## 2019-06-19 DIAGNOSIS — E119 Type 2 diabetes mellitus without complications: Secondary | ICD-10-CM

## 2019-06-19 MED ORDER — FERROUS SULFATE 325 (65 FE) MG PO TABS: 325.0000 mg | ORAL_TABLET | ORAL | 0 refills | Status: DC

## 2019-06-19 NOTE — Telephone Encounter (Signed)
Patient needs endocrine referral for T2DM, with A1c of 12.2. Also sent PO iron for iron deficiency.  Marlowe Alt, DO [facility] 06/19/2019 4:41 AM

## 2019-06-19 NOTE — Telephone Encounter (Signed)
See providers message regarding referral.

## 2019-06-21 ENCOUNTER — Ambulatory Visit: Payer: 59 | Admitting: Obstetrics

## 2019-06-21 ENCOUNTER — Other Ambulatory Visit: Payer: Self-pay

## 2019-06-21 ENCOUNTER — Encounter: Payer: Self-pay | Admitting: Obstetrics

## 2019-06-21 VITALS — BP 154/115 | HR 106 | Wt 288.0 lb

## 2019-06-21 DIAGNOSIS — N939 Abnormal uterine and vaginal bleeding, unspecified: Secondary | ICD-10-CM

## 2019-06-21 DIAGNOSIS — E66813 Obesity, class 3: Secondary | ICD-10-CM

## 2019-06-21 DIAGNOSIS — E119 Type 2 diabetes mellitus without complications: Secondary | ICD-10-CM | POA: Diagnosis not present

## 2019-06-21 DIAGNOSIS — L0291 Cutaneous abscess, unspecified: Secondary | ICD-10-CM | POA: Diagnosis not present

## 2019-06-21 DIAGNOSIS — I10 Essential (primary) hypertension: Secondary | ICD-10-CM

## 2019-06-21 MED ORDER — CARVEDILOL 12.5 MG PO TABS: 12.5000 mg | ORAL_TABLET | Freq: Two times a day (BID) | ORAL | 11 refills | Status: DC

## 2019-06-21 MED ORDER — TRIAMTERENE-HCTZ 37.5-25 MG PO CAPS: 1.0000 | ORAL_CAPSULE | Freq: Every day | ORAL | 11 refills | Status: DC

## 2019-06-21 NOTE — Progress Notes (Signed)
Patient ID: Lindsay Welch, female   DOB: Feb 06, 1991, 29 y.o.   MRN: [id number]  Chief Complaint  Patient presents with  . Follow-up    HPI Lindsay Welch is a 29 y.o. female.  Feels that abscesses on abdomen are draining and healing well.  Has history of AUB and is taking Megase with cessation of bleeding. HPI  Past Medical History:  Diagnosis Date  . Hypertension   . Tachycardia   . Vaginal bleeding     History reviewed. No pertinent surgical history.  Family History  Problem Relation Age of Onset  . Hypertension Mother   . Hypertension Father     Social History Social History   Tobacco Use  . Smoking status: Current Some Day Smoker    Types: Cigars  . Smokeless tobacco: Never Used  Substance Use Topics  . Alcohol use: Yes    Comment: socially  . Drug use: Never    No Known Allergies  Current Outpatient Medications  Medication Sig Dispense Refill  . doxycycline (ADOXA) 100 MG tablet Take 1 tablet (100 mg total) by mouth 2 (two) times daily for 10 days. 20 tablet 0  . Estradiol Valerate-Dienogest (NATAZIA) 3/2-2/2-3/1 MG tablet Take 1 tablet by mouth daily. 1 Package 11  . ferrous sulfate 325 (65 FE) MG tablet Take 1 tablet (325 mg total) by mouth every other day. 180 tablet 0  . megestrol (MEGACE) 40 MG tablet Take 3 tablets (120 mg total) by mouth daily for 5 days, THEN 2 tablets (80 mg total) daily for 5 days, THEN 1 tablet (40 mg total) daily. 55 tablet 0  . carvedilol (COREG) 12.5 MG tablet Take 1 tablet (12.5 mg total) by mouth 2 (two) times daily with a meal. 60 tablet 11  . triamterene-hydrochlorothiazide (DYAZIDE) 37.5-25 MG capsule Take 1 each (1 capsule total) by mouth daily. 30 capsule 11   No current facility-administered medications for this visit.    Review of Systems Review of Systems Constitutional: negative for fatigue and weight loss Respiratory: negative for cough and wheezing Cardiovascular: negative for chest pain, fatigue and  palpitations Gastrointestinal: negative for abdominal pain and change in bowel habits Genitourinary:negative Integument/breast: negative for nipple discharge Musculoskeletal:negative for myalgias Neurological: negative for gait problems and tremors Behavioral/Psych: negative for abusive relationship, depression Endocrine: negative for temperature intolerance      Blood pressure (!) 154/115, pulse (!) 106, weight 288 lb (130.6 kg), last menstrual period 06/01/2019.  Physical Exam Physical Exam General:   alert  Skin:   no rash or abnormalities  Lungs:   clear to auscultation bilaterally  Heart:   regular rate and rhythm, S1, S2 normal, no murmur, click, rub or gallop  Breasts:   normal without suspicious masses, skin or nipple changes or axillary nodes  Abdomen:  normal findings: no organomegaly, soft, non-tender and no hernia.  Multiple healing abscesses  Pelvis:  External genitalia: normal general appearance Urinary system: urethral meatus normal and bladder without fullness, nontender Vaginal: normal without tenderness, induration or masses Cervix: normal appearance Adnexa: normal bimanual exam Uterus: anteverted and non-tender, normal size    50% of 20 min visit spent on counseling and coordination of care.   Data Reviewed Labs  Assessment     1. Abnormal uterine bleeding - clinically stable  2. Abscess - much improved - finish antibiotics  3. Type 2 diabetes mellitus without complication, without long-term current use of insulin ([place]) - has appointment with Endocrinology  4. HTN (hypertension), benign Rx: - carvedilol (  COREG) 12.5 MG tablet; Take 1 tablet (12.5 mg total) by mouth 2 (two) times daily with a meal.  Dispense: 60 tablet; Refill: 11 - triamterene-hydrochlorothiazide (DYAZIDE) 37.5-25 MG capsule; Take 1 each (1 capsule total) by mouth daily.  Dispense: 30 capsule; Refill: 11 - Ambulatory referral to Internal Medicine  5. Class 3 severe obesity due to  excess calories without serious comorbidity in adult, unspecified BMI (HCC) - program of caloric restriction, exercise and behavioral modification recommended    Plan    Follow up 2 months for Annual  Orders Placed This Encounter  Procedures  . Ambulatory referral to Internal Medicine    Referral Priority:   Routine    Referral Type:   Consultation    Referral Reason:   Specialty Services Required    Requested Specialty:   Internal Medicine    Number of Visits Requested:   1   Meds ordered this encounter  Medications  . carvedilol (COREG) 12.5 MG tablet    Sig: Take 1 tablet (12.5 mg total) by mouth 2 (two) times daily with a meal.    Dispense:  60 tablet    Refill:  11  . triamterene-hydrochlorothiazide (DYAZIDE) 37.5-25 MG capsule    Sig: Take 1 each (1 capsule total) by mouth daily.    Dispense:  30 capsule    Refill:  11     Lindsay Bad, MD 06/21/2019 9:14 AM

## 2019-06-21 NOTE — Progress Notes (Signed)
Pt is here for follow up for drainage of 2 abscesses. Pt reports she is feeling better and feels she is healing well. Pt was recently diagnosed with type 2 diabetes and already has an appt with an endocrinologist.

## 2019-07-03 ENCOUNTER — Other Ambulatory Visit: Payer: Self-pay

## 2019-07-03 ENCOUNTER — Ambulatory Visit: Payer: 59 | Admitting: Internal Medicine

## 2019-07-03 ENCOUNTER — Encounter: Payer: Self-pay | Admitting: Internal Medicine

## 2019-07-03 ENCOUNTER — Telehealth: Payer: Self-pay | Admitting: *Deleted

## 2019-07-03 VITALS — BP 124/84 | HR 105 | Temp 98.3°F | Ht 64.0 in | Wt 274.8 lb

## 2019-07-03 DIAGNOSIS — E1165 Type 2 diabetes mellitus with hyperglycemia: Secondary | ICD-10-CM | POA: Diagnosis not present

## 2019-07-03 DIAGNOSIS — R739 Hyperglycemia, unspecified: Secondary | ICD-10-CM

## 2019-07-03 DIAGNOSIS — E785 Hyperlipidemia, unspecified: Secondary | ICD-10-CM | POA: Diagnosis not present

## 2019-07-03 LAB — LIPID PANEL
Cholesterol: 186 mg/dL (ref 0–200)
HDL: 35.6 mg/dL — ABNORMAL LOW (ref >39.00)
LDL Cholesterol: 118 mg/dL — ABNORMAL HIGH (ref 0–99)
NonHDL: 150.83
Total CHOL/HDL Ratio: 5
Triglycerides: 166 mg/dL — ABNORMAL HIGH (ref 0.0–149.0)
VLDL: 33.2 mg/dL (ref 0.0–40.0)

## 2019-07-03 LAB — MICROALBUMIN / CREATININE URINE RATIO
Creatinine,U: 114.9 mg/dL
Microalb Creat Ratio: 6.4 mg/g (ref 0.0–30.0)
Microalb, Ur: 7.4 mg/dL — ABNORMAL HIGH (ref 0.0–1.9)

## 2019-07-03 LAB — GLUCOSE, POCT (MANUAL RESULT ENTRY): POC Glucose: 349 mg/dl — AB (ref 70–99)

## 2019-07-03 MED ORDER — METFORMIN HCL ER 500 MG PO TB24: 1000.0000 mg | ORAL_TABLET | Freq: Two times a day (BID) | ORAL | 6 refills | Status: DC

## 2019-07-03 MED ORDER — LANTUS SOLOSTAR 100 UNIT/ML ~~LOC~~ SOPN: 26.0000 [IU] | PEN_INJECTOR | Freq: Every day | SUBCUTANEOUS | 6 refills | Status: DC

## 2019-07-03 MED ORDER — GLUCOSE BLOOD VI STRP: ORAL_STRIP | 12 refills | Status: DC

## 2019-07-03 MED ORDER — INSULIN PEN NEEDLE 32G X 4 MM MISC: 1.0000 | Freq: Every day | 6 refills | Status: AC

## 2019-07-03 NOTE — Progress Notes (Signed)
Name: Lindsay Welch  MRN/ DOB: [id number], 1990-11-20   Age/ Sex: 29 y.o., female    PCP: Patient, No Pcp Per   Reason for Endocrinology Evaluation: Type 2 Diabetes Mellitus     Date of Initial Endocrinology Visit: 07/04/2019     PATIENT IDENTIFIER: Ms. Eleonore Welch is a 29 y.o. female with a past medical history of HTN  . The patient presented for initial endocrinology clinic visit on 07/04/2019 for consultative assistance with her diabetes management.    HPI: Ms. Crandell was    Diagnosed with T2DM 05/2019- was noted to have an A1c 12.2% during evaluation of heavy menstruation Prior Medications tried/Intolerance: N/A Currently checking blood sugars 0 x / day Hypoglycemia episodes :no  Hemoglobin A1c 12.2%  Patient has required hospitalization within the last 1 year from hyper or hypoglycemia: no   In terms of diet, the patient eats 3 meals a day, snacks 1-2x a day. Has been drinking juice and soda in the past    She is a Freight forwarder at [employer]    [place]: N/A   Statin: No ACE-I/ARB: no Prior Diabetic Education: no   METER DOWNLOAD SUMMARY: Did not bring   DIABETIC COMPLICATIONS: Microvascular complications:    Denies: Neuropathy, CKD, and known retinopathy  Last eye exam: Completed 2019  Macrovascular complications:    Denies: CAD, PVD, CVA   PAST HISTORY: Past Medical History:  Past Medical History:  Diagnosis Date  . Hypertension   . Tachycardia   . Vaginal bleeding     Past Surgical History: No past surgical history on file.   Social History:  reports that she has been smoking cigars. She has never used smokeless tobacco. She reports current alcohol use. She reports that she does not use drugs. Family History:  Family History  Problem Relation Age of Onset  . Hypertension Mother   . Hypertension Father      HOME MEDICATIONS: Allergies as of 07/03/2019   No Known Allergies     Medication List       Accurate as of  July 03, 2019 11:59 PM. If you have any questions, ask your nurse or doctor.        carvedilol 12.5 MG tablet Commonly known as: Coreg Take 1 tablet (12.5 mg total) by mouth 2 (two) times daily with a meal. What changed: when to take this   Estradiol Valerate-Dienogest 3/2-2/2-3/1 MG tablet Commonly known as: NATAZIA Take 1 tablet by mouth daily.   ferrous sulfate 325 (65 FE) MG tablet Take 1 tablet (325 mg total) by mouth every other day.   glucose blood test strip Use as instructed to test blood sugar 2 times daily R73.9 Started by: Jeannetta Nap, CMA   Insulin Pen Needle 32G X 4 MM Misc 1 Device by Does not apply route daily. Started by: Dorita Sciara, MD   Lantus SoloStar 100 UNIT/ML Solostar Pen Generic drug: Insulin Glargine Inject 26 Units into the skin daily. Started by: Dorita Sciara, MD   megestrol 40 MG tablet Commonly known as: MEGACE Take 3 tablets (120 mg total) by mouth daily for 5 days, THEN 2 tablets (80 mg total) daily for 5 days, THEN 1 tablet (40 mg total) daily. Start taking on: June 01, 2019   metFORMIN 500 MG 24 hr tablet Commonly known as: Glucophage XR Take 2 tablets (1,000 mg total) by mouth 2 (two) times daily. Started by: Dorita Sciara, MD   triamterene-hydrochlorothiazide 37.5-25  MG capsule Commonly known as: Dyazide Take 1 each (1 capsule total) by mouth daily.        ALLERGIES: No Known Allergies   REVIEW OF SYSTEMS: A comprehensive ROS was conducted with the patient and is negative except as per HPI and below:  Review of Systems  Gastrointestinal: Negative for constipation and nausea.  Genitourinary: Negative for frequency.  Neurological: Positive for tingling.       Hands   Endo/Heme/Allergies: Negative for polydipsia.      OBJECTIVE:   VITAL SIGNS: BP 124/84 (BP Location: Left Arm, Patient Position: Sitting, Cuff Size: Large)   Pulse (!) 105   Temp 98.3 F (36.8 C)   Ht 5\' 4"  (1.626 m)    Wt 274 lb 12.8 oz (124.6 kg)   SpO2 98%   BMI 47.17 kg/m    PHYSICAL EXAM:  General: Pt appears well and is in NAD  HEENT:  Eyes: External eye exam normal without stare, lid lag or exophthalmos.  EOM intact.    Neck: General: Supple without adenopathy or carotid bruits. Thyroid: Thyroid size normal.  No goiter or nodules appreciated. No thyroid bruit.  Lungs: Clear with good BS bilat with no rales, rhonchi, or wheezes  Heart: RRR with normal S1 and S2 and no gallops; no murmurs; no rub  Abdomen: Normoactive bowel sounds, soft, nontender, without masses or organomegaly palpable  Extremities:  Lower extremities - No pretibial edema. No lesions.  Skin: Normal texture and temperature to palpation. No rash noted. No Acanthosis nigricans/skin tags. No lipohypertrophy.  Neuro: MS is good with appropriate affect, pt is alert and Ox3    DM foot exam: 07/03/2019  The skin of the feet is intact without sores or ulcerations. The pedal pulses are 2+ on right and 2+ on left. The sensation is intact to a screening 5.07, 10 gram monofilament bilaterally   DATA REVIEWED:  Lab Results  Component Value Date   HGBA1C 12.2 (H) 06/14/2019   Lab Results  Component Value Date   MICROALBUR 7.4 (H) 07/03/2019   LDLCALC 118 (H) 07/03/2019   CREATININE 0.75 06/01/2019      Results for ALIVIANA, BURDELL (MRN Dorthula Matas) as of 07/04/2019 07:53  Ref. Range 07/03/2019 09:47  Total CHOL/HDL Ratio Unknown 5  Cholesterol Latest Ref Range: 0 - 200 mg/dL 08/31/2019  HDL Cholesterol Latest Ref Range: >39.00 mg/dL 035 (L)  LDL (calc) Latest Ref Range: 0 - 99 mg/dL 00.93 (H)  MICROALB/CREAT RATIO Latest Ref Range: 0.0 - 30.0 mg/g 6.4  NonHDL Unknown 150.83  Triglycerides Latest Ref Range: 0.0 - 149.0 mg/dL 818 (H)  VLDL Latest Ref Range: 0.0 - 40.0 mg/dL 299.3    ASSESSMENT / PLAN / RECOMMENDATIONS:   1) Type 2 Diabetes Mellitus, Newly Diagnosed, Without complications - Most recent A1c of 12.2% %. Goal A1c < 7.0 %.     Plan: GENERAL: I have discussed with the patient the pathophysiology of diabetes. We went over the natural progression of the disease. We talked about both insulin resistance and insulin deficiency. We stressed the importance of lifestyle changes including diet and exercise. I explained the complications associated with diabetes including retinopathy, nephropathy, neuropathy as well as increased risk of cardiovascular disease. We went over the benefit seen with glycemic control.   I explained to the patient that diabetic patients are at higher than normal risk for amputations.    Patient educated today on insulin injections, and fingersticks.  We discussed starting Metformin, we discussed the GI side effects  of Metformin, patient encouraged to contact us with any concerns  We discussed the importance of glucose checks at home, patient advised to bring the meter on next visit.  MEDICATIONS:  Metformin 500 mg 2 tabs twice daily-titration provided  Lantus 26 units daily  EDUCATION / INSTRUCTIONS:  BG monitoring instructions: Patient is instructed to check her blood sugars 2 times a day, fasting and bedtime.  Call [facility] if: BG persistently < 70 or > 300. . I reviewed the Rule of 15 for the treatment of hypoglycemia in detail with the patient. Literature supplied.   2) Diabetic complications:   Eye: Unknown to have known diabetic retinopathy.  Patient strongly encouraged to have an eye exam ASAP  Neuro/ Feet: Does not have known diabetic peripheral neuropathy.  Renal: Patient does not have known baseline CKD. She is not on an ACEI/ARB at present.Check urine albumin/creatinine ratio yearly starting at time of diagnosis. If albuminuria is positive, treatment is geared toward better glucose, blood pressure control and use of ACE inhibitors or ARBs. Monitor electrolytes and creatinine once to twice yearly.   3) Dyslipidemia:   -Mildly elevated LDL at 118 mg/DL, no  indication to start statin therapy at this time, will consider starting a statin age 83 -We discussed the cardiovascular benefits of statins     4) Hypertension: She is at goal of < 140/90 mmHg.    Follow-up in 2 months   Signed electronically by: Lyndle Herrlich, MD  [facility]  [facility] [address] [place], [place] [address] Phone: [phone] FAX: [phone]   CC: Patient, No Pcp Per No address on file Phone: None  Fax: None    Return to Endocrinology clinic as below: Future Appointments  Date Time Provider Department Center  07/08/2019 10:30 AM Bonnita Levan, RD [facility]  08/12/2019  8:15 AM Leftwich-Kirby, Wilmer Floor, CNM [facility] None  09/04/2019  8:10 AM Gerome Kokesh, Konrad Dolores, MD [facility] None

## 2019-07-03 NOTE — Telephone Encounter (Signed)
Please call [phone] to schedule 24 hour ambulatory blood pressure monitor ordered by Dr. Mayford Knife.

## 2019-07-03 NOTE — Patient Instructions (Addendum)
-   Metformin 500 mg Take 1 tablet with Breakfast for 1 week, if no vomiting or diarrhea, increase to 1 tablet with Breakfast and 1 table with Supper for another week. If no vomiting or diarrhea, increase to 2 tablets with Breakfast and continue 1 tablet with Supper for another week. If no vomiting or diarrhea, increase to 2 tablets with Breakfast and 2 tablets with Supper and continue on this dose.    - Lantus 26 units daily    - Check sugar before you eat in the morning and at bedtime when you can   - Contact us if your sugars remain above 200 or less then 80 mg/dL       - Choose healthy, lower carb lower calorie snacks: toss salad, cooked vegetables, cottage cheese, peanut butter, low fat cheese / string cheese, lower sodium deli meat, tuna salad or chicken salad     HOW TO TREAT LOW BLOOD SUGARS (Blood sugar LESS THAN 70 MG/DL)  Please follow the RULE OF 15 for the treatment of hypoglycemia treatment (when your (blood sugars are less than 70 mg/dL)    STEP 1: Take 15 grams of carbohydrates when your blood sugar is low, which includes:   3-4 GLUCOSE TABS  OR  3-4 OZ OF JUICE OR REGULAR SODA OR  ONE TUBE OF GLUCOSE GEL     STEP 2: RECHECK blood sugar in 15 MINUTES STEP 3: If your blood sugar is still low at the 15 minute recheck --> then, go back to STEP 1 and treat AGAIN with another 15 grams of carbohydrates.

## 2019-07-04 ENCOUNTER — Other Ambulatory Visit: Payer: Self-pay

## 2019-07-04 MED ORDER — CONTOUR NEXT TEST VI STRP: ORAL_STRIP | 12 refills | Status: DC

## 2019-07-04 MED ORDER — CONTOUR NEXT ONE KIT: 1.0000 | PACK | Freq: Every day | 0 refills | Status: AC

## 2019-07-04 MED ORDER — LANCET DEVICE MISC: 1.0000 | Freq: Every day | 6 refills | Status: DC

## 2019-07-05 ENCOUNTER — Encounter: Payer: Self-pay | Admitting: Internal Medicine

## 2019-07-08 ENCOUNTER — Encounter: Payer: Self-pay | Admitting: Dietician

## 2019-07-08 ENCOUNTER — Other Ambulatory Visit: Payer: Self-pay

## 2019-07-08 ENCOUNTER — Encounter: Payer: 59 | Attending: Internal Medicine | Admitting: Dietician

## 2019-07-08 DIAGNOSIS — E119 Type 2 diabetes mellitus without complications: Secondary | ICD-10-CM

## 2019-07-08 NOTE — Progress Notes (Signed)
Diabetes Self-Management Education  Visit Type: First/Initial  Appt. Start Time: 1030 Appt. End Time: 1200  07/11/2019  [name], identified by name and date of birth, is a 29 y.o. female with a diagnosis of Diabetes: Type 2.   ASSESSMENT History includes Newly diagnosed Type 2 diabetes and HTN.  Patient of Dr. Lonzo Cloud. A1C 12.2% 06/14/19 Medications include:  Lantus 26 units q HS She has tried Metformin and had stomach cramps and diarrhea.  She was told to try a lower dose.  Metformin XR is ordered.  She plans on trying 2/12 on her day off. Fasting BG has not been less than 200.  Weight hx: 274 lbs 07/07/2018 288 lbs 06/13/2018 Always struggled with her weight Highest weight >300 lbs Lowest adult weight 250 lbs  Patient's brother is a long distance truck driver and lives with her at times..  She does her own cooking and shopping.  She works 8-10 hours per day for 6-7 days per week.  She works first and second shift at [employer], [employer].  She is a Production designer, theatre/television/film.  She climbs ladders at work.  She plans on starting to exercise 3 times per week at the gym. She has started to meal prep.  Height 5\' 4"  (1.626 m), weight 274 lb (124.3 kg). Body mass index is 47.03 kg/m.  Diabetes Self-Management Education - 07/08/19 1049      Visit Information   Visit Type  First/Initial      Initial Visit   Diabetes Type  Type 2    Are you currently following a meal plan?  No    Are you taking your medications as prescribed?  No   intolerant to metformin   Date Diagnosed  05/2019      Health Coping   How would you rate your overall health?  Good      Psychosocial Assessment   Patient Belief/Attitude about Diabetes  Afraid    Self-care barriers  None    Self-management support  Doctor's office    Other persons present  Patient    Patient Concerns  Nutrition/Meal planning;Glycemic Control;Weight Control    Special Needs  None    Preferred Learning Style  No preference indicated    Learning Readiness  Ready    How often do you need to have someone help you when you read instructions, pamphlets, or other written materials from your doctor or pharmacy?  1 - Never    What is the last grade level you completed in school?  Bachelor's degree      Pre-Education Assessment   Patient understands the diabetes disease and treatment process.  Needs Instruction    Patient understands incorporating nutritional management into lifestyle.  Needs Instruction    Patient undertands incorporating physical activity into lifestyle.  Needs Instruction    Patient understands using medications safely.  Needs Instruction    Patient understands monitoring blood glucose, interpreting and using results  Needs Instruction    Patient understands prevention, detection, and treatment of acute complications.  Needs Instruction    Patient understands prevention, detection, and treatment of chronic complications.  Needs Instruction    Patient understands how to develop strategies to address psychosocial issues.  Needs Instruction    Patient understands how to develop strategies to promote health/change behavior.  Needs Instruction      Complications   Last HgB A1C per patient/outside source  12.2 %   06/14/2019.   How often do you check your blood sugar?  1-2 times/day    Fasting Blood glucose range (mg/dL)  >025   427 this am   Postprandial Blood glucose range (mg/dL)  >062    Number of hypoglycemic episodes per month  0    Number of hyperglycemic episodes per week  21    Can you tell when your blood sugar is high?  No    Have you had a dilated eye exam in the past 12 months?  No    Have you had a dental exam in the past 12 months?  No   appointment soon   Are you checking your feet?  No      Dietary Intake   Breakfast  2 boiled eggs    Snack (morning)  occasional string cheese or apple and peanut butter    Lunch  salad or leftovers    Snack (afternoon)  occasional string cheese    Dinner   salad OR zucchini noodles and spaghetti sauce (marinara)    Snack (evening)  fruit    Beverage(s)  water, rare diet lemonade      Exercise   Exercise Type  Light (walking / raking leaves)   just at work     Patient Education   Previous Diabetes Education  No    Disease state   Definition of diabetes, type 1 and 2, and the diagnosis of diabetes    Nutrition management   Role of diet in the treatment of diabetes and the relationship between the three main macronutrients and blood glucose level;Meal options for control of blood glucose level and chronic complications.    Physical activity and exercise   Role of exercise on diabetes management, blood pressure control and cardiac health.    Medications  Reviewed patients medication for diabetes, action, purpose, timing of dose and side effects.    Monitoring  Purpose and frequency of SMBG.;Identified appropriate SMBG and/or A1C goals.;Daily foot exams;Yearly dilated eye exam    Acute complications  Taught treatment of hypoglycemia - the 15 rule.    Chronic complications  Relationship between chronic complications and blood glucose control    Psychosocial adjustment  Worked with patient to identify barriers to care and solutions;Role of stress on diabetes    Preconception care  Role of family planning for patients with diabetes      Individualized Goals (developed by patient)   Nutrition  General guidelines for healthy choices and portions discussed    Physical Activity  Exercise 3-5 times per week;30 minutes per day    Medications  take my medication as prescribed    Monitoring   test my blood glucose as discussed    Reducing Risk  increase portions of healthy fats    Health Coping  discuss diabetes with (comment)   MD, RD, CDE     Post-Education Assessment   Patient understands the diabetes disease and treatment process.  Demonstrates understanding / competency    Patient understands incorporating nutritional management into lifestyle.   Needs Review    Patient undertands incorporating physical activity into lifestyle.  Demonstrates understanding / competency    Patient understands using medications safely.  Needs Review    Patient understands monitoring blood glucose, interpreting and using results  Needs Review    Patient understands prevention, detection, and treatment of acute complications.  Demonstrates understanding / competency    Patient understands prevention, detection, and treatment of chronic complications.  Demonstrates understanding / competency    Patient understands how to develop strategies to address psychosocial  issues.  Demonstrates understanding / competency    Patient understands how to develop strategies to promote health/change behavior.  Needs Review      Outcomes   Expected Outcomes  Demonstrated interest in learning. Expect positive outcomes    Future DMSE  4-6 wks    Program Status  Not Completed       Individualized Plan for Diabetes Self-Management Training:   Learning Objective:  Patient will have a greater understanding of diabetes self-management. Patient education plan is to attend individual and/or group sessions per assessed needs and concerns.   Plan:   Patient Instructions  Consider getting your vitamin D checked. Make a diabetes eye appointment Rotate insulin injection sites When you retry the Metformin, take this after breakfast and only 1 tablet.  Be sure that this is the Extended release Metformin.  Aim for 2-3 Carb Choices per meal (30-45 grams) +/- 1 either way  Aim for 0-1 Carbs per snack if hungry - non carbohydrate snacks Include protein in moderation with your meals and snacks Consider reading food labels for Total Carbohydrate of foods Consider  increasing your activity level by walking for 30 minutes daily as tolerated Consider checking BG at alternate times per day  Consider taking medication as directed by MD     Expected Outcomes:  Demonstrated interest in  learning. Expect positive outcomes  Education material provided: ADA - How to Thrive: A Guide for Your Journey with Diabetes, Food label handouts, Meal plan card and Snack sheet  If problems or questions, patient to contact team via:  Phone and Email  Future DSME appointment: 4-6 wks

## 2019-07-08 NOTE — Patient Instructions (Addendum)
Consider getting your vitamin D checked. Make a diabetes eye appointment Rotate insulin injection sites When you retry the Metformin, take this after breakfast and only 1 tablet.  Be sure that this is the Extended release Metformin.  Aim for 2-3 Carb Choices per meal (30-45 grams) +/- 1 either way  Aim for 0-1 Carbs per snack if hungry - non carbohydrate snacks Include protein in moderation with your meals and snacks Consider reading food labels for Total Carbohydrate of foods Consider  increasing your activity level by walking for 30 minutes daily as tolerated Consider checking BG at alternate times per day  Consider taking medication as directed by MD

## 2019-07-10 ENCOUNTER — Other Ambulatory Visit: Payer: Self-pay | Admitting: Obstetrics

## 2019-07-10 DIAGNOSIS — I10 Essential (primary) hypertension: Secondary | ICD-10-CM

## 2019-07-15 ENCOUNTER — Telehealth: Payer: Self-pay | Admitting: Dietician

## 2019-07-15 NOTE — Telephone Encounter (Signed)
Called patient to review medication tolerance. Tolerated the metformin (1/2 tablet) better on 2/12.  She states that she plans on taking this with dinner each night.  She has mild side effects.  Discussed that this may improve as her body adjusts to this.   She is to call with questions and has a f/u appointment with me in 1 month. Fasting and premeal BG remain >185.  Oran Rein, RD, LDN, CDE

## 2019-07-16 ENCOUNTER — Telehealth: Payer: Self-pay | Admitting: *Deleted

## 2019-07-16 NOTE — Telephone Encounter (Signed)
Called to schedule appointment for 24 hour ambulatory blood pressure monitor.  Patient at work and asked if she could call back during her lunch hour today to schedule,

## 2019-08-01 ENCOUNTER — Telehealth: Payer: Self-pay

## 2019-08-01 NOTE — Telephone Encounter (Signed)
New message    Referral from Dr. Coral Ceo @ [facility]   Consultation to see Primary Care.   The patient is informed the Physician is not accepting any new patients at this time, the NP is the only one accepting patients.   The patient verbalized understanding

## 2019-08-02 ENCOUNTER — Ambulatory Visit: Payer: 59 | Admitting: Dietician

## 2019-08-09 ENCOUNTER — Encounter: Payer: 59 | Admitting: Dietician

## 2019-08-12 ENCOUNTER — Ambulatory Visit (INDEPENDENT_AMBULATORY_CARE_PROVIDER_SITE_OTHER): Payer: 59 | Admitting: Advanced Practice Midwife

## 2019-08-12 ENCOUNTER — Other Ambulatory Visit: Payer: Self-pay

## 2019-08-12 ENCOUNTER — Encounter: Payer: Self-pay | Admitting: Advanced Practice Midwife

## 2019-08-12 VITALS — BP 142/70 | HR 105 | Wt 274.8 lb

## 2019-08-12 DIAGNOSIS — Z124 Encounter for screening for malignant neoplasm of cervix: Secondary | ICD-10-CM | POA: Diagnosis not present

## 2019-08-12 DIAGNOSIS — Z1151 Encounter for screening for human papillomavirus (HPV): Secondary | ICD-10-CM | POA: Diagnosis not present

## 2019-08-12 DIAGNOSIS — N921 Excessive and frequent menstruation with irregular cycle: Secondary | ICD-10-CM

## 2019-08-12 DIAGNOSIS — Z01419 Encounter for gynecological examination (general) (routine) without abnormal findings: Secondary | ICD-10-CM | POA: Diagnosis not present

## 2019-08-12 DIAGNOSIS — N939 Abnormal uterine and vaginal bleeding, unspecified: Secondary | ICD-10-CM

## 2019-08-12 MED ORDER — NORGESTIMATE-ETH ESTRADIOL 0.25-35 MG-MCG PO TABS: 1.0000 | ORAL_TABLET | Freq: Every day | ORAL | 11 refills | Status: AC

## 2019-08-12 NOTE — Progress Notes (Signed)
Subjective:     Lindsay Welch is a 29 y.o. female here at [facility] for a routine exam.  Current complaints: heavy vaginal bleeding has improved since she was seen in ED in January but she is still bleeding frequently on the birth control pill prescribed last month.  There are no other symptoms.  Personal health questionnaire reviewed: yes.  Do you have a primary care provider? No, working on it Do you feel safe at home? yes Has anyone hit, slapped, or kicked you recently? no  Depression screen [facility] 2/9 08/13/2019 07/08/2019  Decreased Interest 0 0  Down, Depressed, Hopeless 0 0  PHQ - 2 Score 0 0      Gynecologic History Patient's last menstrual period was 07/28/2019 (lmp unknown). Contraception: OCP (estrogen/progesterone) Last Pap: None.  Last mammogram: N/a  Obstetric History OB History  Gravida Para Term Preterm AB Living  0 0 0 0 0 0  SAB TAB Ectopic Multiple Live Births  0 0 0 0 0     The following portions of the patient's history were reviewed and updated as appropriate: allergies, current medications, past family history, past medical history, past social history, past surgical history and problem list.  Review of Systems Pertinent items noted in HPI and remainder of comprehensive ROS otherwise negative.    Objective:     BP (!) 142/70   Pulse (!) 105   Wt 274 lb 12.8 oz (124.6 kg)   LMP 07/28/2019 (LMP Unknown)   BMI 47.17 kg/m    VS reviewed, nursing note reviewed,  Constitutional: well developed, well nourished, no distress HEENT: normocephalic CV: normal rate Pulm/chest wall: normal effort Breast Exam:  Deferred due to current recommendations Abdomen: soft Neuro: alert and oriented x 3 Skin: warm, dry Psych: affect normal Pelvic exam: Cervix pink, visually closed, without lesion, scant dark brown discharge, vaginal walls and external genitalia normal Bimanual exam: Cervix 0/long/high, firm, anterior, neg CMT, uterus nontender, nonenlarged, adnexa  without tenderness, enlargement, or mass     Assessment/Plan:   1. Encounter for screening for cervical cancer  --Never had Pap - Cytology - PAP( [facility])  2. Abnormal uterine bleeding (AUB) --Was seen in ED 06/01/19 with heavy frequent bleeding.  Started on Megace. Completed course of Megace.  Seen by Dr Clearance Coots on 06/21/19, started on continuous OCPs.   --Frequent breakthrough bleeding on continuous OCPs so switch to Sprintec 28 today. --Discussed options for managing AUB including lifestyle changes/weight loss to reduce estrogen stores, continuous use of Megace, OCPs, or IUD. Pt would like to continue OCPs but stop breakthrough bleeding. --  norgestimate-ethinyl estradiol (ORTHO-CYCLEN) 0.25-35 MG-MCG tablet; Take 1 tablet by mouth daily.  Dispense: 1 Package; Refill: 11  --F/U in 3 months with female MD. Pt prefers female providers.  3. Well woman exam with routine gynecological exam   4. Metrorrhagia --Light frequent bleeding on continuous OCPs, before had heavier frequent bleeding before OCPs started.   Follow up in: 3 months or as needed.   Sharen Counter, CNM 9:12 AM

## 2019-08-12 NOTE — Progress Notes (Signed)
Last pap- unknown  No sti testing

## 2019-08-12 NOTE — Patient Instructions (Signed)
For primary care try [facility] or [facility]: . [address] [place] [phone]       [facility] . [address] [place] [phone]  [facility]: [address] [phone]   Metrorrhagia Metrorrhagia is bleeding from the uterus that happens irregularly but often. The bleeding generally happens between menstrual periods. Follow these instructions at home: Pay attention to any changes in your symptoms. Let your health care provider know about them. Follow these instructions to help with your condition: Eating and drinking   Eat well-balanced meals. Include foods that are high in iron, such as liver, meat, shellfish, green leafy vegetables, and eggs.  If you become constipated: ? Drink plenty of water. Drink enough to keep your urine pale yellow. ? Take over-the-counter or prescription medicines. ? Eat foods that are high in fiber, such as beans, whole grains, and fresh fruits and vegetables. ? Limit foods that are high in fat and processed sugars, such as fried or sweet foods. Medicines  Take over-the-counter and prescription medicines only as told by your health care provider.  Do not change medicines without talking with your health care provider.  Aspirin or medicines that contain aspirin may make the bleeding worse. Do not take these medicines: ? During your period. ? During the week before your period.  If you were prescribed iron pills, take them as told by your health care provider. Iron pills help to replace iron that your body loses because of this condition. Activity  If you need to change your sanitary pad or tampon more than one time every 2 hours: ? Lie in bed with your feet raised (elevated). ? Place a cold pack on your lower abdomen. ? Rest as much as possible until the bleeding stops or  slows down. General instructions   For 2 months, write down: ? When your period starts. ? When your period ends. ? When any abnormal bleeding occurs. ? What problems you notice.  Keep all follow-up visits as told by your health care provider. This is important. Contact a health care provider if:  You get light-headed or weak.  You have nausea and vomiting.  You cannot eat or drink without vomiting.  You feel dizzy or have diarrhea while you are taking medicine.  Have questions about birth control. Get help right away if:  You develop a fever or chills.  You need to change your sanitary pad or tampon more than one time per hour.  Your bleeding becomesheavy.  Your flow contains clots.  You develop pain in your abdomen.  You lose consciousness.  You develop a rash. Summary  Metrorrhagia is bleeding from the uterus that happens irregularly but often, usually between menstrual periods.  Pay attention to any changes in your symptoms. Let your health care provider know about them.  Eat well-balanced meals. Include foods that are high in iron, such as liver, meat, shellfish, green leafy vegetables, and eggs.  Get help right away if you develop a fever, you see clots in your blood, your bleeding becomes heavy, you develop a rash, or you lose consciousness. This information is not intended to replace advice given to you by your health care provider. Make sure you discuss any questions you have with your health care provider. Document Revised: 11/16/2017 Document Reviewed: 11/16/2017 Elsevier Patient Education  2020 [employer].

## 2019-08-13 DIAGNOSIS — N939 Abnormal uterine and vaginal bleeding, unspecified: Secondary | ICD-10-CM | POA: Insufficient documentation

## 2019-08-14 LAB — CYTOLOGY - PAP
Comment: NEGATIVE
Diagnosis: NEGATIVE
High risk HPV: NEGATIVE

## 2019-08-26 ENCOUNTER — Ambulatory Visit: Payer: 59 | Admitting: Internal Medicine

## 2019-08-26 ENCOUNTER — Encounter: Payer: Self-pay | Admitting: Internal Medicine

## 2019-08-26 VITALS — BP 143/106 | HR 121 | Temp 98.5°F | Ht 64.0 in | Wt 277.7 lb

## 2019-08-26 DIAGNOSIS — R000 Tachycardia, unspecified: Secondary | ICD-10-CM

## 2019-08-26 DIAGNOSIS — E1169 Type 2 diabetes mellitus with other specified complication: Secondary | ICD-10-CM

## 2019-08-26 DIAGNOSIS — I10 Essential (primary) hypertension: Secondary | ICD-10-CM

## 2019-08-26 DIAGNOSIS — Z713 Dietary counseling and surveillance: Secondary | ICD-10-CM

## 2019-08-26 DIAGNOSIS — Z6841 Body Mass Index (BMI) 40.0 and over, adult: Secondary | ICD-10-CM

## 2019-08-26 DIAGNOSIS — E119 Type 2 diabetes mellitus without complications: Secondary | ICD-10-CM | POA: Diagnosis not present

## 2019-08-26 DIAGNOSIS — Z79899 Other long term (current) drug therapy: Secondary | ICD-10-CM

## 2019-08-26 DIAGNOSIS — Z794 Long term (current) use of insulin: Secondary | ICD-10-CM

## 2019-08-26 MED ORDER — CHLORTHALIDONE 25 MG PO TABS: 25.0000 mg | ORAL_TABLET | Freq: Every day | ORAL | 2 refills | Status: DC

## 2019-08-26 MED ORDER — LOSARTAN POTASSIUM 25 MG PO TABS: 25.0000 mg | ORAL_TABLET | Freq: Every day | ORAL | 2 refills | Status: DC

## 2019-08-26 NOTE — Progress Notes (Signed)
   CC: To establish care  HPI:LindsayLindsay Welch is a 29 y.o. female who presents today to establish care for DMII. Please see individual problem based A/P for details.   Past Medical History:  Diagnosis Date  . Diabetes mellitus without complication (HCC)   . Hypertension   . Tachycardia   . Vaginal bleeding    Review of Systems:   Review of Systems  Constitutional: Negative for chills, diaphoresis and fever.  HENT: Negative for ear pain and sinus pain.   Eyes: Negative for blurred vision, photophobia and redness.  Respiratory: Negative for cough and shortness of breath.   Cardiovascular: Negative for chest pain and leg swelling.  Gastrointestinal: Negative for constipation, diarrhea, nausea and vomiting.  Genitourinary: Negative for flank pain and urgency.  Musculoskeletal: Negative for myalgias.  Neurological: Negative for dizziness and headaches.  Psychiatric/Behavioral: Negative for depression. The patient is not nervous/anxious.    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[employer], political party  PMHx: Abnormal uterine bleeding Tonsillar cyst  Surgical Hx: Denied  Allergies: NKDA  Physical Exam: Vitals:   08/26/19 1513  BP: (!) 143/106  Pulse: (!) 121  Temp: 98.5 F (36.9 C)  TempSrc: Oral  SpO2: 99%  Weight: 277 lb 11.2 oz (126 kg)  Height: 5\' 4"  (1.626 m)   General: A/O x4, in no acute distress, afebrile, nondiaphoretic HEENT: PEERL, EMO intact Cardio: RRR, no mrg's  Pulmonary: CTA bilaterally, no wheezing or crackles  Abdomen: Bowel sounds normal, soft, nontender  MSK: BLE nontender, nonedematous Neuro: Alert, CNII-XII grossly intact, conversational, strength 5/5 in the upper and lower extremities bilaterally, normal gait Psych: Appropriate affect, not depressed in appearance, engages well  Assessment & Plan:   See Encounters Tab for problem based  charting.  Patient discussed with Dr. 

## 2019-08-26 NOTE — Patient Instructions (Addendum)
FOLLOW-UP INSTRUCTIONS When: 4-6 wks For: Repeat BMP and blood pressure What to bring: All of your medications  Today we discussed your diabetes, high blood pressure and desire for weight loss. For the diabetes, you may continue to follow with endocrinology as you have been. If they prefer, our clinic is able to manage your diabetes at this stage and is more than willing to assist with this.  For your high blood pressure, we will stop the current medication and start Losartan and chlorthalidone each once daily. I will notify you of the results of any labs from today's evaluation when available to me.   Thank you for your visit to the [facility] today. If you have any questions or concerns please call us at [phone].

## 2019-08-27 ENCOUNTER — Encounter: Payer: Self-pay | Admitting: Internal Medicine

## 2019-08-27 DIAGNOSIS — R000 Tachycardia, unspecified: Secondary | ICD-10-CM | POA: Insufficient documentation

## 2019-08-27 DIAGNOSIS — Z6841 Body Mass Index (BMI) 40.0 and over, adult: Secondary | ICD-10-CM | POA: Insufficient documentation

## 2019-08-27 LAB — BMP8+ANION GAP
Anion Gap: 17 mmol/L (ref 10.0–18.0)
BUN/Creatinine Ratio: 17 (ref 9–23)
BUN: 19 mg/dL (ref 6–20)
CO2: 22 mmol/L (ref 20–29)
Calcium: 9.6 mg/dL (ref 8.7–10.2)
Chloride: 99 mmol/L (ref 96–106)
Creatinine, Ser: 1.14 mg/dL — ABNORMAL HIGH (ref 0.57–1.00)
GFR calc Af Amer: 74 mL/min/{1.73_m2} (ref >59)
GFR calc non Af Amer: 64 mL/min/{1.73_m2} (ref >59)
Glucose: 94 mg/dL (ref 65–99)
Potassium: 4.1 mmol/L (ref 3.5–5.2)
Sodium: 138 mmol/L (ref 134–144)

## 2019-08-27 NOTE — Assessment & Plan Note (Signed)
Tachycardia: Persistent today at 121. TSH WNL's 1.636. Patient has seen cardiology. It was felt that she was volume depleted at that time. However, today, she no longer appears volume depleted. Etiology uncertain. Would recommend continuing to follow with cardiology. Patient unable to tolerate carvedilol due to symptomatic bradycardia per her attestation.

## 2019-08-27 NOTE — Assessment & Plan Note (Signed)
Hypertension: Patient's BP today is 143/106 with a goal of <140/80. The patient endorses adherence to her medication regimen. She denied, chest pain, headache, visual changes, lightheadedness, weakness, dizziness on standing, swelling in the feet or ankles. Advised to continue contraception while on the current medications.    Plan: Discontinue triamterene-HCTZ 37.5-25mg  daily Start Losartan 25mg  daily Start chlorthalidone 25mg  daily  BMP today with slightly elevated sCr. Recommend repeating at next visit.

## 2019-08-27 NOTE — Assessment & Plan Note (Signed)
DMII: Hgb A1c 12.2 % at the last visit. The patient denied polyuria, polydipsia, headache, fatigue, confusion, nausea, vomiting or diaphoresis. Currently on Metformin ER 250mg  BID which is the highest dose she has been able to tolerate. Was having, abdominal pain, cramping, and loose stool. Glucose trend 99, 108, 124, 95, 103, 147, 94, 152, 102, 87, and 92 after dinner each day for the past 11 days.  Plan: Follow-up with endocrinology on April Advised patient that if she preferred or if Endocrinology preferred that our clinic could manage her DMII treatment at this time Continue Metformin 500mg  daily Continue Lantus 26 units daily Repeat A1c at next visit Advised her to discuss a GLP-1 with her endocrinologist vs insulin

## 2019-08-27 NOTE — Assessment & Plan Note (Addendum)
BMI 48: Patient discusses desire for weight loss. She is interested in exercising with weights. She has an unremarkable Echo from January of this year. Aorta is upper limits of normal in diameter. Echo was ordered for tachycardia. Aside from her tachycardia I do not feel that she is at increased risk of an adverse event with regard to exercising. I advised against excessive weight lifting with holding her breath and advised that she start a graduated program.  Plan: She will begin dieting and exercising  Will discuss GLP-1 with her endocrinologist

## 2019-08-27 NOTE — Progress Notes (Signed)
Internal Medicine Clinic Attending  Case discussed with Dr. Harbrecht at the time of the visit.  We reviewed the resident's history and exam and pertinent patient test results.  I agree with the assessment, diagnosis, and plan of care documented in the resident's note.   

## 2019-08-28 ENCOUNTER — Telehealth: Payer: Self-pay

## 2019-08-28 NOTE — Telephone Encounter (Signed)
Patient states that she was not aware that it was Lindsay Welch trying to contact her. I advised that call would be coming from an [phone] number and that she should answer. Patient verbalized understanding.

## 2019-08-28 NOTE — Telephone Encounter (Signed)
Just let [facility] and [facility] know that they need to try to contact patient again  Thank you!  Lindsay Welch

## 2019-08-28 NOTE — Telephone Encounter (Signed)
-----   Message from Quintella Reichert, MD sent at 08/26/2019  8:09 PM EDT ----- Regarding: RE: Sleep Study Please find out why patient has not responded to [facility] about her home sleep study  Traci ----- Message ----- From: Patricia Pesa, RN Sent: 08/26/2019   1:44 PM EDT To: Quintella Reichert, MD Subject: Sleep Study                                    Hi Dr Dionisio Paschal has been unable to reach this patient so they have voided her from their system.    Thank you,  Barth Kirks

## 2019-08-29 NOTE — Telephone Encounter (Signed)
Contacted [facility]/resent patient's office note and orders back over requesting that they reach out to the patient again.

## 2019-09-03 ENCOUNTER — Other Ambulatory Visit: Payer: Self-pay

## 2019-09-03 NOTE — Progress Notes (Signed)
Name: Lindsay Welch  Age/ Sex: 29 y.o., female   MRN/ DOB: [id number], Sep 30, 1990     PCP: Kathi Ludwig, MD   Reason for Endocrinology Evaluation: Type 2 Diabetes Mellitus  Initial Endocrine Consultative Visit: 07/24/2019    PATIENT IDENTIFIER: Lindsay Welch is a 29 y.o. female with a past medical history of HTN and T2DM. The patient has followed with Endocrinology clinic since 07/04/2019 for consultative assistance with management of her diabetes.  DIABETIC HISTORY:  Lindsay Welch was diagnosed with T2DM in 05/2019 when she was noted to have an A1c of 12.2% during evaluation of heavy menstruation. She was referred to [place] and we started her on Metformin and basal insulin.     SUBJECTIVE:   During the last visit (07/04/2019): Newly diagnosed. Started on Metformin and basal insulin.   Today (09/04/2019): Lindsay Welch is here for a follow up on diabetes management.  She checks her blood sugars 3 times daily. The patient has had hypoglycemic episodes since the last clinic visit, which typically occur at variable times . Otherwise, the patient has not  required any recent emergency interventions for hypoglycemia and has not had recent hospitalizations secondary to hyper or hypoglycemic episodes.   She continues with abnormal bleeding , saw Gyn 2 weeks ago.    ROS: As per HPI and as detailed below: Review of Systems  Gastrointestinal: Positive for diarrhea.      HOME DIABETES REGIMEN:  Lantus 26 units daily  Metformin 500 mg, Half a tablet twice a day     METER DOWNLOAD SUMMARY: Date range evaluated: 3/25-09/04/2019 Fingerstick Blood Glucose Tests = 20 Average Number Tests/Day = 1.4 Overall Mean FS Glucose = 100 Standard Deviation = 17  BG Ranges: Low = 66 High = 139   Hypoglycemic Events/30 Days: BG < 50 = 0 Episodes of symptomatic severe hypoglycemia = 0    DIABETIC COMPLICATIONS: Microvascular complications:    Denies: Neuropathy, CKD, and known  retinopathy  Last eye exam: Completed 2019  Macrovascular complications:    Denies: CAD, PVD, CVA  HISTORY:  Past Medical History:  Past Medical History:  Diagnosis Date  . Diabetes mellitus without complication ([place])   . Hypertension   . Tachycardia   . Vaginal bleeding     Past Surgical History: No past surgical history on file.  Social History:  reports that she quit smoking about 4 months ago. Her smoking use included cigars. She has never used smokeless tobacco. She reports current alcohol use. She reports that she does not use drugs. Family History:  Family History  Problem Relation Age of Onset  . Hypertension Mother   . Hypertension Father      HOME MEDICATIONS: Allergies as of 09/04/2019   No Known Allergies     Medication List       Accurate as of September 04, 2019  9:15 AM. If you have any questions, ask your nurse or doctor.        STOP taking these medications   ferrous sulfate 325 (65 FE) MG tablet Stopped by: Dorita Sciara, MD     TAKE these medications   chlorthalidone 25 MG tablet Commonly known as: HYGROTON Take 1 tablet (25 mg total) by mouth daily.   Contour Next One Kit 1 kit by Does not apply route daily.   Contour Next Test test strip Generic drug: glucose blood Use as instructed to test blood sugar 2 times daily R73.9   FreeStyle Libre 14 Day Reader Kerrin Mo 1  Device by Does not apply route as directed. Started by: Dorita Sciara, MD   FreeStyle Libre 14 Day Sensor Misc 1 Device by Does not apply route as directed. Started by: Dorita Sciara, MD   Insulin Pen Needle 32G X 4 MM Misc 1 Device by Does not apply route daily.   Lancet Device Misc 1 Device by Does not apply route daily.   Lantus SoloStar 100 UNIT/ML Solostar Pen Generic drug: insulin glargine Inject 20 Units into the skin daily. What changed: how much to take Changed by: Dorita Sciara, MD   losartan 25 MG tablet Commonly known as:  COZAAR Take 1 tablet (25 mg total) by mouth daily.   metFORMIN 500 MG 24 hr tablet Commonly known as: Glucophage XR Take 1 tablet (500 mg total) by mouth 2 (two) times daily. What changed: how much to take Changed by: Dorita Sciara, MD   norgestimate-ethinyl estradiol 0.25-35 MG-MCG tablet Commonly known as: ORTHO-CYCLEN Take 1 tablet by mouth daily.        OBJECTIVE:   Vital Signs: BP 118/72 (BP Location: Left Arm, Patient Position: Sitting, Cuff Size: Large)   Pulse (!) 111   Temp 98.2 F (36.8 C)   Ht '5\' 4"'  (1.626 m)   Wt 270 lb 9.6 oz (122.7 kg)   LMP 07/28/2019 (LMP Unknown)   SpO2 98%   BMI 46.45 kg/m   Wt Readings from Last 3 Encounters:  09/04/19 270 lb 9.6 oz (122.7 kg)  08/26/19 277 lb 11.2 oz (126 kg)  08/12/19 274 lb 12.8 oz (124.6 kg)     Exam: General: Pt appears well and is in NAD  Lungs: Clear with good BS bilat with no rales, rhonchi, or wheezes  Heart: RRR with normal S1 and S2 and no gallops; no murmurs; no rub  Extremities: No pretibial edema. No tremor. Normal strength and motion throughout. See detailed diabetic foot exam below.  Neuro: MS is good with appropriate affect, pt is alert and Ox3     DM foot exam: 09/04/2019  The skin of the feet is intact without sores or ulcerations. The pedal pulses are 2+ on right and 2+ on left. The sensation is intact to a screening 5.07, 10 gram monofilament bilaterally       DATA REVIEWED:  Lab Results  Component Value Date   HGBA1C 7.8 (A) 09/04/2019   HGBA1C 12.2 (H) 06/14/2019   Lab Results  Component Value Date   MICROALBUR 7.4 (H) 07/03/2019   LDLCALC 118 (H) 07/03/2019   CREATININE 1.14 (H) 08/26/2019   Lab Results  Component Value Date   MICRALBCREAT 6.4 07/03/2019     Lab Results  Component Value Date   CHOL 186 07/03/2019   HDL 35.60 (L) 07/03/2019   LDLCALC 118 (H) 07/03/2019   TRIG 166.0 (H) 07/03/2019   CHOLHDL 5 07/03/2019         ASSESSMENT / PLAN /  RECOMMENDATIONS:   1) Type 2 Diabetes Mellitus, Newly Diagnosed with improved glycemic control Without  complications - Most recent A1c of 7.8 %. Goal A1c < 7.0 %.    - A1c down from 12.2%  - Will adjust her insulin as below  - PCP recommended adding Victoza , I just think this is the right time to add a GLP-1 agonists given she is barely tolerating half a dose of metformin due to diarrhea. Once her GI issues settle down, will consider this.  - We did discuss adding an SGLT-2 inhibitors  but after discussing the risk of genital infections , she opted to hold off as she is already having issues with vaginal bleeds.  - Pt interested in CGM, prescription for Freestyle libre was sent    MEDICATIONS:  Continue Metformin 500 mg , Half a tablet twice daily - this is all she can tolerate   Decrease Lantus to 20 units daily   EDUCATION / INSTRUCTIONS:  BG monitoring instructions: Patient is instructed to check her blood sugars 1 times a day, fasting .  Call [place] [facility] if: BG persistently < 70 or > 300. . I reviewed the Rule of 15 for the treatment of hypoglycemia in detail with the patient. Literature supplied.   2) Diabetic complications:   Eye: Unknown to have known diabetic retinopathy. She has na upcoming appointment in 09/2019  Neuro/ Feet: Does not have known diabetic peripheral neuropathy .   Renal: Patient does not have known baseline CKD. She   is  on an ACEI/ARB at present.    F/U in 3 months    Signed electronically by: Mack Guise, MD  [facility] Endocrinology  [facility] [address] [place], [place] [address] Phone: [phone] FAX: [phone]   CC: Kathi Ludwig, MD [place] [address] Phone: [phone]  Fax: [phone]  Return to Endocrinology clinic as below: Future Appointments  Date Time Provider [place]  09/13/2019  9:30 AM Clydell Hakim, RD [place] [facility]   12/04/2019  9:50 AM Jontue Crumpacker, Melanie Crazier, MD [facility] None

## 2019-09-04 ENCOUNTER — Encounter: Payer: Self-pay | Admitting: Internal Medicine

## 2019-09-04 ENCOUNTER — Ambulatory Visit: Payer: 59 | Admitting: Internal Medicine

## 2019-09-04 VITALS — BP 118/72 | HR 111 | Temp 98.2°F | Ht 64.0 in | Wt 270.6 lb

## 2019-09-04 DIAGNOSIS — E1169 Type 2 diabetes mellitus with other specified complication: Secondary | ICD-10-CM | POA: Diagnosis not present

## 2019-09-04 LAB — POCT GLYCOSYLATED HEMOGLOBIN (HGB A1C): Hemoglobin A1C: 7.8 % — AB (ref 4.0–5.6)

## 2019-09-04 MED ORDER — FREESTYLE LIBRE 14 DAY SENSOR MISC: 1.0000 | 11 refills | Status: DC

## 2019-09-04 MED ORDER — METFORMIN HCL ER 500 MG PO TB24: 500.0000 mg | ORAL_TABLET | Freq: Two times a day (BID) | ORAL | 1 refills | Status: DC

## 2019-09-04 MED ORDER — FREESTYLE LIBRE 14 DAY READER DEVI: 1.0000 | 0 refills | Status: DC

## 2019-09-04 MED ORDER — LANTUS SOLOSTAR 100 UNIT/ML ~~LOC~~ SOPN: 20.0000 [IU] | PEN_INJECTOR | Freq: Every day | SUBCUTANEOUS | 6 refills | Status: DC

## 2019-09-04 NOTE — Patient Instructions (Signed)
-   Metformin 500 mg, Half a tablet twice daily  - Decrease Lantus to 20 units daily      HOW TO TREAT LOW BLOOD SUGARS (Blood sugar LESS THAN 70 MG/DL)  Please follow the RULE OF 15 for the treatment of hypoglycemia treatment (when your (blood sugars are less than 70 mg/dL)    STEP 1: Take 15 grams of carbohydrates when your blood sugar is low, which includes:   3-4 GLUCOSE TABS  OR  3-4 OZ OF JUICE OR REGULAR SODA OR  ONE TUBE OF GLUCOSE GEL     STEP 2: RECHECK blood sugar in 15 MINUTES STEP 3: If your blood sugar is still low at the 15 minute recheck --> then, go back to STEP 1 and treat AGAIN with another 15 grams of carbohydrates.

## 2019-09-13 ENCOUNTER — Encounter: Payer: Self-pay | Admitting: Dietician

## 2019-09-13 ENCOUNTER — Encounter: Payer: 59 | Attending: Internal Medicine | Admitting: Dietician

## 2019-09-13 ENCOUNTER — Other Ambulatory Visit: Payer: Self-pay

## 2019-09-13 DIAGNOSIS — E119 Type 2 diabetes mellitus without complications: Secondary | ICD-10-CM | POA: Diagnosis present

## 2019-09-13 DIAGNOSIS — E1169 Type 2 diabetes mellitus with other specified complication: Secondary | ICD-10-CM

## 2019-09-13 NOTE — Patient Instructions (Signed)
Great job on the changes that you have made? Consider trying Inositol. Continue your positive habits.

## 2019-09-13 NOTE — Progress Notes (Addendum)
Diabetes Self-Management Education  Visit Type:  Follow-up  Appt. Start Time: 0955 Appt. End Time: 1040  09/13/2019  [name], identified by name and date of birth, is a 29 y.o. female with a diagnosis of Diabetes: Type 2.    ASSESSMENT [employer] off [address] for meal plans and meal prepping. They do not use salt and sugar and they are preparing her meals.  Breakfast:  Shake or oatmeal Lunch and Dinner:  Protein, vegetables and 45 grams of carbs or less. Beverages:  Water, occasional diet lemonade or diet soda  Works out 4 days per week for 45 minutes with a Psychologist, educational.  History includes Newly diagnosed Type 2 diabetes and HTN.  Patient of Dr. Lonzo Cloud. A1C 12.2% 06/14/19 Medications include:  Lantus 26 units q HS, Metformin 1/2 tablet bid- tolerating well. Fasting BG has not been less than 200.  Weight hx: 264 lbs 09/13/2019 274 lbs 07/07/2018 288 lbs 06/13/2018 Always struggled with her weight Highest weight >300 lbs Lowest adult weight 250 lbs  Patient was educated on the [employer] 14 day -Getting to know device    Herbalist) -Setting up device , trend arrows - sensor interaction with vitamin C: do not take more than 500 mg vitamin C daily -Setting alert profile (high alert  180 , low alert 80)  setting up reminders (reminders for testing at least every 8 hours) -Inserting sensor (patient applied the sensor on his right  upper back of arm  herself today with minimal assist. It appeared to be working and in warm up when he left the office) -Ending sensor session -Tape guide, ability to upload from home with email address (he says he nor wife do email)   Provided patient with a sample of Inositol as well as a pamphlet about this nutritional supplement that can improve female cycles and improve insulin resistance.  Patient's brother is a long distance truck driver and lives with her at times.. She does her own cooking and shopping.  She works 8-10 hours per  day for 6-7 days per week.  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  She climbs ladders at work.    Weight 264 lb (119.7 kg). Body mass index is 45.32 kg/m.   Diabetes Self-Management Education - 09/13/19 1827      Pre-Education Assessment   Patient understands the diabetes disease and treatment process.  Needs Review    Patient understands incorporating nutritional management into lifestyle.  Needs Review    Patient undertands incorporating physical activity into lifestyle.  Needs Review    Patient understands using medications safely.  Needs Review    Patient understands monitoring blood glucose, interpreting and using results  Needs Review    Patient understands prevention, detection, and treatment of acute complications.  Needs Review    Patient understands prevention, detection, and treatment of chronic complications.  Needs Review    Patient understands how to develop strategies to address psychosocial issues.  Needs Review    Patient understands how to develop strategies to promote health/change behavior.  Needs Review      Complications   Last HgB A1C per patient/outside source  7.8 %   08/2019 decreased from 12.2%   How often do you check your blood sugar?  1-2 times/day    Have you had a dilated eye exam in the past 12 months?  No   appointment in May     Dietary Intake   Breakfast  Protein Shake or oatmeal    Lunch  Protein, vegetables and 45 grams of carbs or less    Dinner  Protein, vegetables and 45 grams of carbs or less    Beverage(s)  Water, occasional diet lemonade or diet soda      Exercise   Exercise Type  Light (walking / raking leaves)    How many days per week to you exercise?  4    How many minutes per day do you exercise?  45    Total minutes per week of exercise  180      Patient Education   Previous Diabetes Education  Yes (please comment)   07/2019 with RD/CDE   Nutrition management   Effects of alcohol on blood  glucose and safety factors with consumption of alcohol.    Medications  Reviewed patients medication for diabetes, action, purpose, timing of dose and side effects.    Monitoring  Other (comment)   Started FreeStyle Libre   Personal strategies to promote health  Lifestyle issues that need to be addressed for better diabetes care      Individualized Goals (developed by patient)   Nutrition  General guidelines for healthy choices and portions discussed    Physical Activity  Exercise 3-5 times per week;45 minutes per day    Medications  take my medication as prescribed    Monitoring   test my blood glucose as discussed    Health Coping  discuss diabetes with (comment)   MD, RD     Patient Self-Evaluation of Goals - Patient rates self as meeting previously set goals (% of time)   Nutrition  >75%    Physical Activity  >75%    Medications  >75%    Monitoring  >75%    Problem Solving  >75%    Reducing Risk  >75%    Health Coping  >75%      Post-Education Assessment   Patient understands the diabetes disease and treatment process.  Demonstrates understanding / competency    Patient understands incorporating nutritional management into lifestyle.  Demonstrates understanding / competency    Patient undertands incorporating physical activity into lifestyle.  Demonstrates understanding / competency    Patient understands using medications safely.  Demonstrates understanding / competency    Patient understands monitoring blood glucose, interpreting and using results  Demonstrates understanding / competency    Patient understands prevention, detection, and treatment of acute complications.  Demonstrates understanding / competency    Patient understands prevention, detection, and treatment of chronic complications.  Demonstrates understanding / competency    Patient understands how to develop strategies to address psychosocial issues.  Demonstrates understanding / competency    Patient understands  how to develop strategies to promote health/change behavior.  Needs Review      Outcomes   Program Status  Not Completed      Subsequent Visit   Since your last visit have you continued or begun to take your medications as prescribed?  Yes    Since your last visit have you experienced any weight changes?  Loss    Weight Loss (lbs)  264    Since your last visit, are you checking your blood glucose at least once a day?  Yes       Learning Objective:  Patient will have a greater understanding of diabetes self-management. Patient education plan is to attend individual and/or group sessions per assessed needs and concerns.   Plan:   Patient Instructions  Randie [employer] job  on the changes that you have made? Consider trying Inositol. Continue your positive habits.  Expected Outcomes:  Demonstrated interest in learning. Expect positive outcomes  If problems or questions, patient to contact team via:  Phone and Email  Future DSME appointment: - 2 months

## 2019-11-16 ENCOUNTER — Other Ambulatory Visit: Payer: Self-pay | Admitting: Internal Medicine

## 2019-11-16 DIAGNOSIS — I10 Essential (primary) hypertension: Secondary | ICD-10-CM

## 2019-12-04 ENCOUNTER — Ambulatory Visit: Payer: 59 | Admitting: Internal Medicine

## 2019-12-06 ENCOUNTER — Ambulatory Visit: Payer: 59 | Admitting: Internal Medicine

## 2019-12-13 ENCOUNTER — Ambulatory Visit: Payer: 59 | Admitting: Dietician

## 2019-12-20 ENCOUNTER — Other Ambulatory Visit: Payer: Self-pay

## 2019-12-20 ENCOUNTER — Ambulatory Visit: Payer: 59 | Admitting: Internal Medicine

## 2019-12-20 ENCOUNTER — Encounter: Payer: Self-pay | Admitting: Internal Medicine

## 2019-12-20 VITALS — BP 122/88 | HR 102 | Ht 63.0 in | Wt 260.2 lb

## 2019-12-20 DIAGNOSIS — E119 Type 2 diabetes mellitus without complications: Secondary | ICD-10-CM | POA: Diagnosis not present

## 2019-12-20 LAB — POCT GLYCOSYLATED HEMOGLOBIN (HGB A1C): Hemoglobin A1C: 5.3 % (ref 4.0–5.6)

## 2019-12-20 MED ORDER — METFORMIN HCL ER 500 MG PO TB24: 500.0000 mg | ORAL_TABLET | Freq: Every day | ORAL | 3 refills | Status: AC

## 2019-12-20 NOTE — Progress Notes (Signed)
Name: Lindsay Welch  Age/ Sex: 29 y.o., female   MRN/ DOB: [id number], 07-09-1990     PCP: Glenis Smoker, MD   Reason for Endocrinology Evaluation: Type 2 Diabetes Mellitus  Initial Endocrine Consultative Visit: 07/24/2019    PATIENT IDENTIFIER: Lindsay Welch is a 29 y.o. female with a past medical history of HTN and T2DM. The patient has followed with Endocrinology clinic since 07/04/2019 for consultative assistance with management of her diabetes.  DIABETIC HISTORY:  Lindsay Welch was diagnosed with T2DM in 05/2019 when she was noted to have an A1c of 12.2% during evaluation of heavy menstruation. She was referred to [place] and we started her on Metformin and basal insulin.     SUBJECTIVE:   During the last visit (09/04/2019): A1c 7.8%. Continued  Metformin and decreased basal insulin.   Today (12/20/2019): Lindsay Welch is here for a follow up on diabetes management.  She checks her blood sugars 3 times daily. The patient has had hypoglycemic episodes since the last clinic visit, which typically occur at variable times during the day , due to that she has not been consistent with metformin intake  . Otherwise, the patient has not  required any recent emergency interventions for hypoglycemia and has not had recent hospitalizations secondary to hyper or hypoglycemic episodes.         HOME DIABETES REGIMEN:  Lantus 20 units daily  Metformin 500 mg, Half a tablet twice a day - occasionally depending on her sugar    METER DOWNLOAD SUMMARY: Date range evaluated: 6/24-7/23/2021 Fingerstick Blood Glucose Tests = 30 Average Number Tests/Day = 1 Overall Mean FS Glucose = 83 Standard Deviation = 13  BG Ranges: Low = 58 High = 126   Hypoglycemic Events/30 Days: BG < 50 = 0 Episodes of symptomatic severe hypoglycemia = 0    DIABETIC COMPLICATIONS: Microvascular complications:    Denies: Neuropathy, CKD, and known retinopathy  Last eye exam: Completed  09/2019  Macrovascular complications:    Denies: CAD, PVD, CVA  HISTORY:  Past Medical History:  Past Medical History:  Diagnosis Date  . Diabetes mellitus without complication ([place])   . Hypertension   . Tachycardia   . Vaginal bleeding     Past Surgical History: No past surgical history on file.  Social History:  reports that she quit smoking about 7 months ago. Her smoking use included cigars. She has never used smokeless tobacco. She reports current alcohol use. She reports that she does not use drugs. Family History:  Family History  Problem Relation Age of Onset  . Hypertension Mother   . Hypertension Father      HOME MEDICATIONS: Allergies as of 12/20/2019   No Known Allergies     Medication List       Accurate as of December 20, 2019  9:23 AM. If you have any questions, ask your nurse or doctor.        STOP taking these medications   FreeStyle Libre 14 Day Reader Kerrin Mo Stopped by: Dorita Sciara, MD   FreeStyle Libre 14 Day Sensor Misc Stopped by: Dorita Sciara, MD   Lantus SoloStar 100 UNIT/ML Solostar Pen Generic drug: insulin glargine Stopped by: Dorita Sciara, MD     TAKE these medications   chlorthalidone 25 MG tablet Commonly known as: HYGROTON TAKE 1 TABLET BY MOUTH EVERY DAY   Contour Next One Kit 1 kit by Does not apply route daily.   Contour Next Test test strip Generic  drug: glucose blood Use as instructed to test blood sugar 2 times daily R73.9   Insulin Pen Needle 32G X 4 MM Misc 1 Device by Does not apply route daily.   Lancet Device Misc 1 Device by Does not apply route daily.   losartan 25 MG tablet Commonly known as: COZAAR TAKE 1 TABLET BY MOUTH EVERY DAY   metFORMIN 500 MG 24 hr tablet Commonly known as: Glucophage XR Take 1 tablet (500 mg total) by mouth daily with breakfast. What changed: when to take this Changed by: Dorita Sciara, MD   norgestimate-ethinyl estradiol 0.25-35 MG-MCG  tablet Commonly known as: ORTHO-CYCLEN Take 1 tablet by mouth daily.        OBJECTIVE:   Vital Signs: BP (!) 122/88 (BP Location: Left Arm, Patient Position: Sitting, Cuff Size: Large)   Pulse 102   Ht '5\' 3"'  (1.6 m)   Wt (!) 260 lb 3.2 oz (118 kg)   SpO2 98%   BMI 46.09 kg/m   Wt Readings from Last 3 Encounters:  12/20/19 (!) 260 lb 3.2 oz (118 kg)  09/13/19 264 lb (119.7 kg)  09/04/19 270 lb 9.6 oz (122.7 kg)     Exam: General: Pt appears well and is in NAD  Lungs: Clear with good BS bilat with no rales, rhonchi, or wheezes  Heart: RRR with normal S1 and S2 and no gallops; no murmurs; no rub  Extremities: No pretibial edema. No tremor. Normal strength and motion throughout. See detailed diabetic foot exam below.  Neuro: MS is good with appropriate affect, pt is alert and Ox3     DM foot exam: 09/04/2019  The skin of the feet is intact without sores or ulcerations. The pedal pulses are 2+ on right and 2+ on left. The sensation is intact to a screening 5.07, 10 gram monofilament bilaterally       DATA REVIEWED:  Lab Results  Component Value Date   HGBA1C 5.3 12/20/2019   HGBA1C 7.8 (A) 09/04/2019   HGBA1C 12.2 (H) 06/14/2019   Lab Results  Component Value Date   MICROALBUR 7.4 (H) 07/03/2019   LDLCALC 118 (H) 07/03/2019   CREATININE 1.14 (H) 08/26/2019   Lab Results  Component Value Date   MICRALBCREAT 6.4 07/03/2019     Lab Results  Component Value Date   CHOL 186 07/03/2019   HDL 35.60 (L) 07/03/2019   LDLCALC 118 (H) 07/03/2019   TRIG 166.0 (H) 07/03/2019   CHOLHDL 5 07/03/2019         ASSESSMENT / PLAN / RECOMMENDATIONS:   1) Type 2 Diabetes Mellitus, optimally Controlled,  Without  complications - Most recent A1c of 5.3 %. Goal A1c < 7.0 %.    - Praised the pt on improved glycemic control and lifestyle modification  - I have encouraged her to continue with lifestyle changes, will stop her insulin but continue Metformin at this time.  -  She is intolerant to higher doses of metformin   MEDICATIONS:  Continue Metformin 500 mg , Half a tablet twice daily - this is all she can tolerate   Stop Lantus   EDUCATION / INSTRUCTIONS:  BG monitoring instructions: Patient is instructed to check her blood sugars 1 times a day, fasting .  Call [place] [facility] clinic if: BG persistently < 70  . I reviewed the Rule of 15 for the treatment of hypoglycemia in detail with the patient. Literature supplied.   2) Diabetic complications:   Eye: Does not have a  known diabetic retinopathy.   Neuro/ Feet: Does not have known diabetic peripheral neuropathy .   Renal: Patient does not have known baseline CKD. She   is  on an ACEI/ARB at present.    F/U in 3 months    Signed electronically by: Mack Guise, MD  [facility]ocrin[facility]  [facility] [address] [place], [place] [address] Phone: [phone] FAX: [phone]   CC: Glenis Smoker, MD [place] [address] Phone: [phone]  Fax: [phone]  Return to Endocrinology clinic as below: Future Appointments  Date Time Provider [place]  03/27/2020  7:50 AM Ahman Dugdale, Melanie Crazier, MD [facility] None

## 2019-12-20 NOTE — Patient Instructions (Addendum)
-   Keep Up the GOOD WORK !! - Continue Metformin 500 mg, Half a tablet twice daily  - STOP Lantus      HOW TO TREAT LOW BLOOD SUGARS (Blood sugar LESS THAN 70 MG/DL)  Please follow the RULE OF 15 for the treatment of hypoglycemia treatment (when your (blood sugars are less than 70 mg/dL)    STEP 1: Take 15 grams of carbohydrates when your blood sugar is low, which includes:   3-4 GLUCOSE TABS  OR  3-4 OZ OF JUICE OR REGULAR SODA OR  ONE TUBE OF GLUCOSE GEL     STEP 2: RECHECK blood sugar in 15 MINUTES STEP 3: If your blood sugar is still low at the 15 minute recheck --> then, go back to STEP 1 and treat AGAIN with another 15 grams of carbohydrates.

## 2019-12-31 ENCOUNTER — Other Ambulatory Visit: Payer: Self-pay | Admitting: Internal Medicine

## 2020-03-27 ENCOUNTER — Ambulatory Visit: Payer: 59 | Admitting: Internal Medicine

## 2020-05-07 NOTE — Telephone Encounter (Signed)
Requested follow up instructions from Dr Mayford Knife regarding sleep study.  No information in [facility] regarding study.

## 2020-05-18 NOTE — Telephone Encounter (Signed)
NO PA REQUIRED FOR HST. ?

## 2020-05-19 ENCOUNTER — Telehealth: Payer: Self-pay | Admitting: *Deleted

## 2020-05-19 DIAGNOSIS — R0683 Snoring: Secondary | ICD-10-CM

## 2020-05-19 DIAGNOSIS — I10 Essential (primary) hypertension: Secondary | ICD-10-CM

## 2020-05-19 NOTE — Telephone Encounter (Signed)
  From: Reesa Chew, CMA  Sent: 05/11/2020 12:02 PM EST  To: [facility] Sleep Studies  Subject: precert                      Home sleep test

## 2020-05-19 NOTE — Telephone Encounter (Signed)
-----   Message from Patricia Pesa, RN sent at 05/15/2020  9:39 AM EST ----- Regarding: RE: Sleep Study Dr Mayford Knife, Please let Lindsay Welch know how best to proceed ----- Message ----- From: Reesa Chew, CMA Sent: 05/11/2020  10:14 AM EST To: Quintella Reichert, MD, Patricia Pesa, RN Subject: RE: Sleep Study                                Reached out to the patient and she says [facility] told her she would have to pay out of pocket for the home study so she declined the study. She says they did not tell her why she would have to pay. I told her her insurance was probably out of network. Thanks, Lindsay Welch   ----- Message ----- From: Quintella Reichert, MD Sent: 05/10/2020   3:04 PM EST To: Reesa Chew, CMA, Patricia Pesa, RN Subject: RE: Sleep Study                                NIna please find out why patient has not done Itamar study  Traci ----- Message ----- From: Patricia Pesa, RN Sent: 05/07/2020  11:29 AM EST To: Quintella Reichert, MD, Theresia Majors, RN Subject: Sleep Study                                    Hey I noticed patient has not completed a sleep study, there is nothing in [facility]. Wondering if you wanted to reach out and see if we can dispense the Watch Pat One from the office or maybe I missed something?  Thank you

## 2020-06-23 ENCOUNTER — Ambulatory Visit (HOSPITAL_BASED_OUTPATIENT_CLINIC_OR_DEPARTMENT_OTHER): Payer: 59 | Attending: Cardiology | Admitting: Cardiology

## 2020-07-08 ENCOUNTER — Ambulatory Visit: Payer: 59 | Admitting: Internal Medicine

## 2020-07-11 ENCOUNTER — Other Ambulatory Visit: Payer: Self-pay | Admitting: Internal Medicine

## 2020-07-11 MED ORDER — CONTOUR NEXT TEST VI STRP: ORAL_STRIP | 0 refills | Status: AC

## 2020-07-11 MED ORDER — LANCET DEVICE MISC: 1.0000 | Freq: Every day | 0 refills | Status: AC

## 2020-08-03 ENCOUNTER — Ambulatory Visit (HOSPITAL_BASED_OUTPATIENT_CLINIC_OR_DEPARTMENT_OTHER): Payer: 59 | Attending: Cardiology | Admitting: Cardiology

## 2020-08-26 ENCOUNTER — Ambulatory Visit: Payer: 59 | Admitting: Internal Medicine

## 2021-04-05 IMAGING — CT CT ANGIO CHEST
2 of 6 series · 18 of 36 positions shown · IV contrast (omnipaque)
Comparison: None.

CLINICAL DATA: Chest pain and shortness of breath. Concern for
pulmonary embolism.

EXAM:
CT ANGIOGRAPHY CHEST WITH CONTRAST
TECHNIQUE: Multidetector CT imaging of the chest was performed using the
standard protocol during bolus administration of intravenous
contrast. Multiplanar CT image reconstructions and MIPs were
obtained to evaluate the vascular anatomy.
CONTRAST:  100mL OMNIPAQUE IOHEXOL 350 MG/ML SOLN

[Series 6: thins · axial · 0.79mm/px · z∈[+1438,+1666]mm · 17 of 258 slices shown]
[im 15/258  lung]
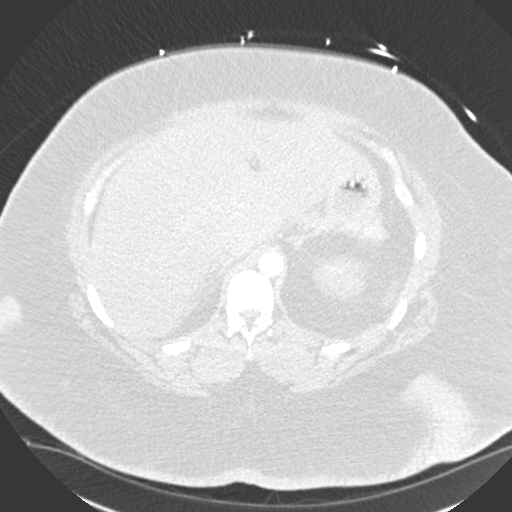
[im 29/258  mediastinal]
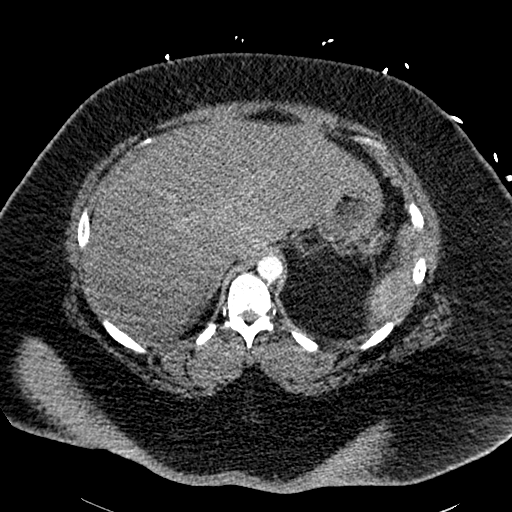
[im 43/258  lung]
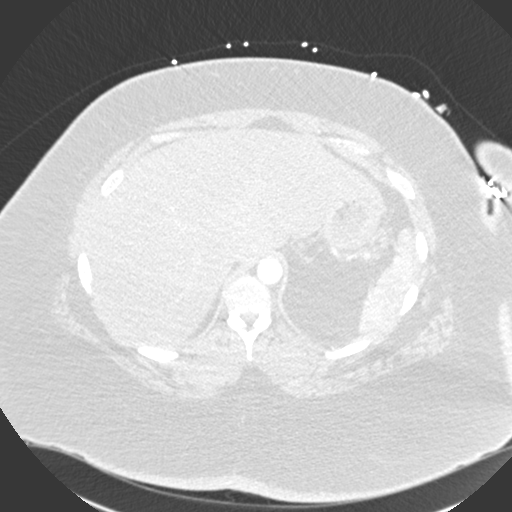
[im 58/258  mediastinal]
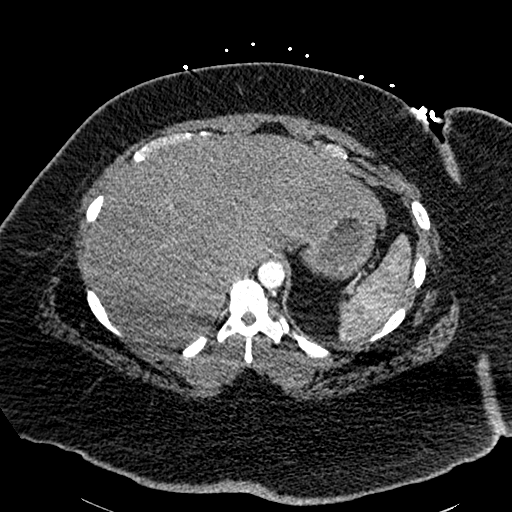
[im 72/258  lung]
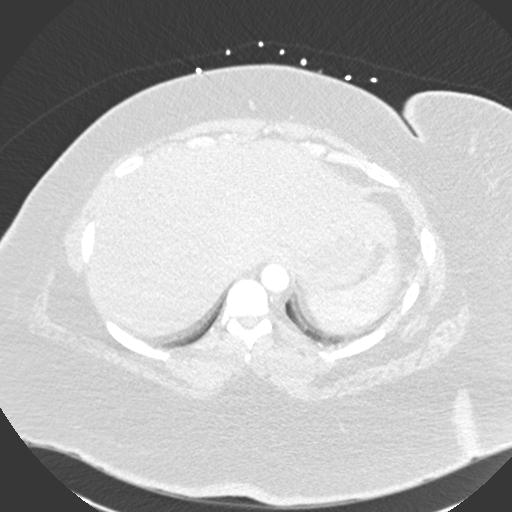
[im 86/258  mediastinal]
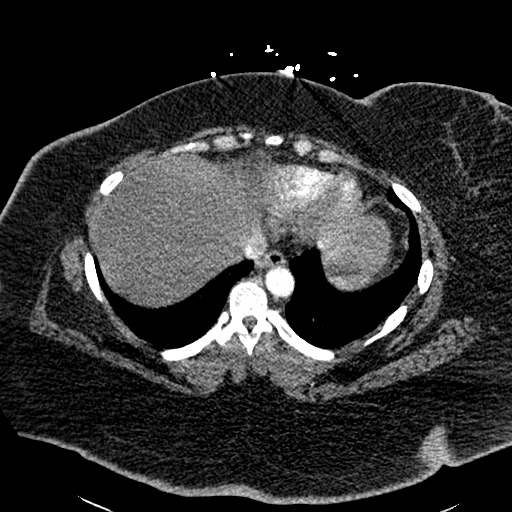
[im 100/258  lung]
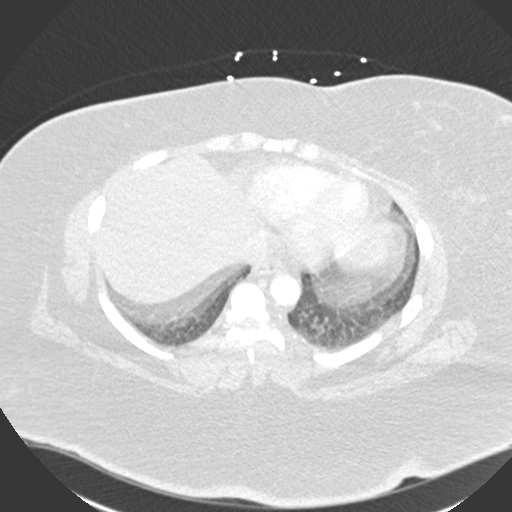
[im 115/258  mediastinal]
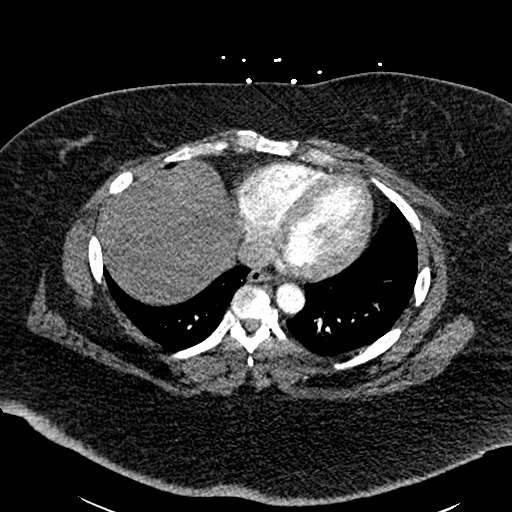
[im 129/258  lung]
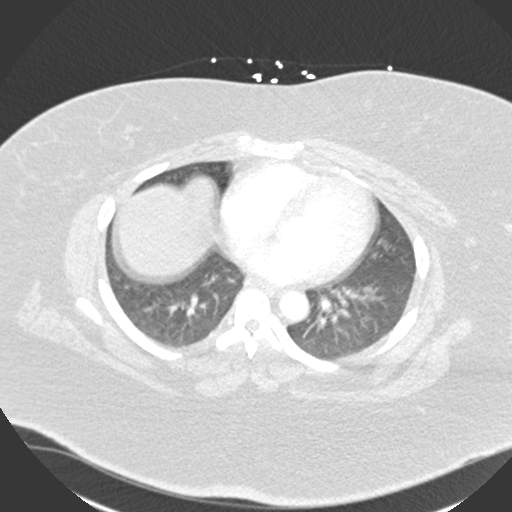
[im 143/258  mediastinal]
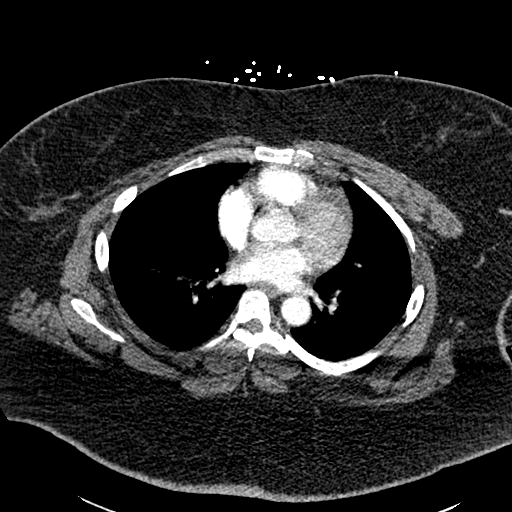
[im 158/258  lung]
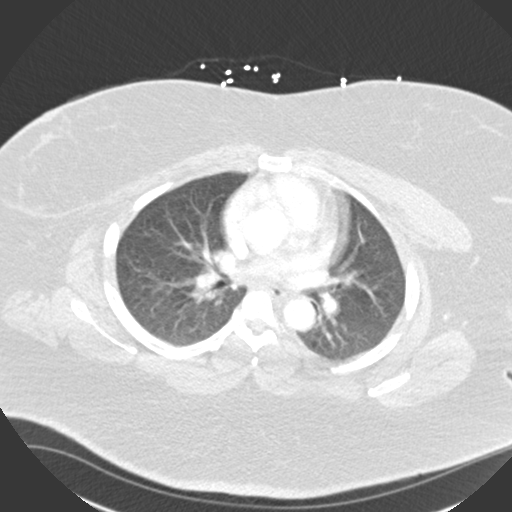
[im 172/258  mediastinal]
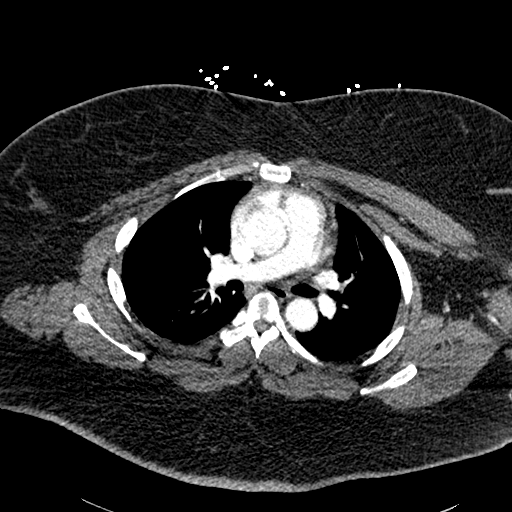
[im 186/258  lung]
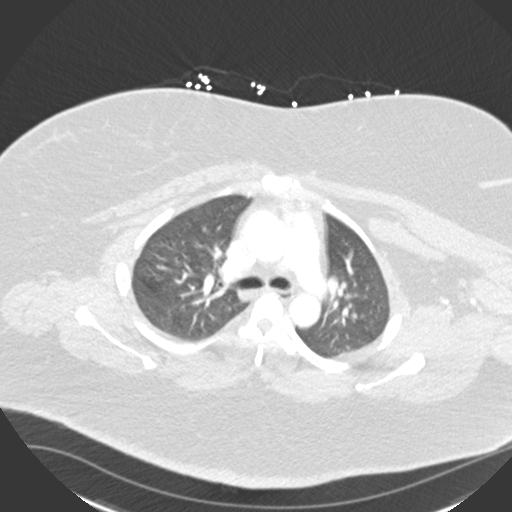
[im 200/258  mediastinal]
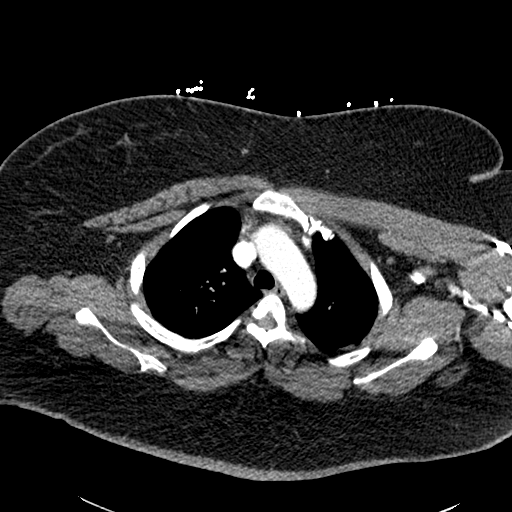
[im 215/258  lung]
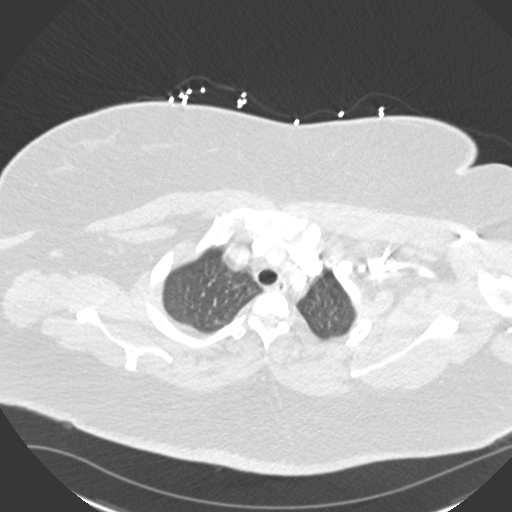
[im 229/258  mediastinal]
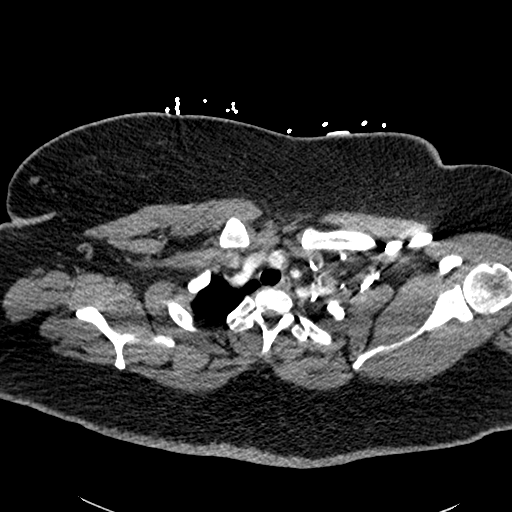
[im 243/258  lung]
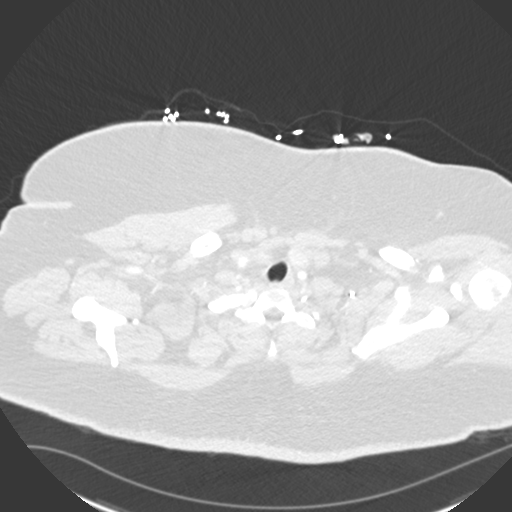

[Series 7: coronal mpr · coronal · 0.56mm/px · 1 of 159 slices shown]
[im 80/159  mediastinal]
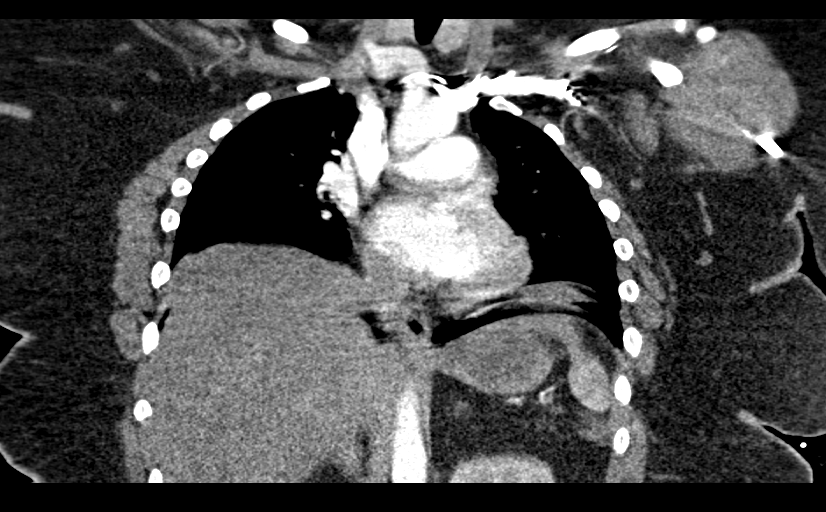

[18 of 36 positions shown; findings below may reference images not displayed]

FINDINGS: Cardiovascular: Multifactorial degradation. Primary limitations
include motion and patient body habitus. The bolus is poorly timed,
further limiting evaluation for pulmonary embolism. No central or
large lobar pulmonary embolism.

Normal aortic caliber. No dissection. Normal heart size, without
pericardial effusion.

Mediastinum/Nodes: No mediastinal or hilar adenopathy.

Lungs/Pleura: No pleural fluid.

Clear lungs.

Upper Abdomen: Probable mild hepatic steatosis. Normal imaged
portions of the spleen, stomach, pancreas, adrenal glands.

Musculoskeletal: No acute osseous abnormality. Mild convex right
thoracic spine curvature.

Review of the MIP images confirms the above findings.
IMPRESSION: 1. Multifactorial degradation, including patient body habitus,
motion, and suboptimal bolus timing. No large or central pulmonary
embolism.
2. No other explanation for chest pain or shortness of breath.
3. Probable mild hepatic steatosis.

## 2021-05-13 DIAGNOSIS — M62838 Other muscle spasm: Secondary | ICD-10-CM | POA: Diagnosis not present

## 2021-05-13 DIAGNOSIS — I10 Essential (primary) hypertension: Secondary | ICD-10-CM | POA: Diagnosis not present

## 2021-07-12 DIAGNOSIS — E7800 Pure hypercholesterolemia, unspecified: Secondary | ICD-10-CM | POA: Diagnosis not present

## 2021-07-12 DIAGNOSIS — E1165 Type 2 diabetes mellitus with hyperglycemia: Secondary | ICD-10-CM | POA: Diagnosis not present

## 2021-07-12 DIAGNOSIS — I10 Essential (primary) hypertension: Secondary | ICD-10-CM | POA: Diagnosis not present

## 2021-07-12 DIAGNOSIS — R252 Cramp and spasm: Secondary | ICD-10-CM | POA: Diagnosis not present

## 2021-07-28 DIAGNOSIS — I10 Essential (primary) hypertension: Secondary | ICD-10-CM | POA: Diagnosis not present

## 2021-07-29 DIAGNOSIS — S86812A Strain of other muscle(s) and tendon(s) at lower leg level, left leg, initial encounter: Secondary | ICD-10-CM | POA: Diagnosis not present

## 2021-07-29 DIAGNOSIS — S86112A Strain of other muscle(s) and tendon(s) of posterior muscle group at lower leg level, left leg, initial encounter: Secondary | ICD-10-CM | POA: Diagnosis not present

## 2021-11-19 DIAGNOSIS — Z01812 Encounter for preprocedural laboratory examination: Secondary | ICD-10-CM | POA: Diagnosis not present

## 2021-12-29 DIAGNOSIS — I10 Essential (primary) hypertension: Secondary | ICD-10-CM | POA: Diagnosis not present

## 2022-02-10 DIAGNOSIS — Z0000 Encounter for general adult medical examination without abnormal findings: Secondary | ICD-10-CM | POA: Diagnosis not present

## 2022-08-15 DIAGNOSIS — E1165 Type 2 diabetes mellitus with hyperglycemia: Secondary | ICD-10-CM | POA: Diagnosis not present

## 2022-08-15 DIAGNOSIS — E7800 Pure hypercholesterolemia, unspecified: Secondary | ICD-10-CM | POA: Diagnosis not present

## 2022-08-15 DIAGNOSIS — I10 Essential (primary) hypertension: Secondary | ICD-10-CM | POA: Diagnosis not present

## 2023-02-21 DIAGNOSIS — I10 Essential (primary) hypertension: Secondary | ICD-10-CM | POA: Diagnosis not present

## 2023-02-21 DIAGNOSIS — E7800 Pure hypercholesterolemia, unspecified: Secondary | ICD-10-CM | POA: Diagnosis not present

## 2023-02-21 DIAGNOSIS — E1165 Type 2 diabetes mellitus with hyperglycemia: Secondary | ICD-10-CM | POA: Diagnosis not present

## 2023-02-21 DIAGNOSIS — Z0000 Encounter for general adult medical examination without abnormal findings: Secondary | ICD-10-CM | POA: Diagnosis not present
